# Patient Record
Sex: Male | Born: 1985 | Race: Black or African American | Hispanic: No | Marital: Single | Smoking: Current every day smoker
Health system: Southern US, Community
[De-identification: ages and names within clinical notes are randomized; demographics above are authoritative.]

---
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
## 2004-06-13 ENCOUNTER — Emergency Department (HOSPITAL_COMMUNITY): Admission: EM | Admit: 2004-06-13 | Discharge: 2004-06-13 | Payer: Self-pay | Admitting: Emergency Medicine

## 2007-01-29 ENCOUNTER — Emergency Department (HOSPITAL_COMMUNITY): Admission: EM | Admit: 2007-01-29 | Discharge: 2007-01-29 | Payer: Self-pay | Admitting: Emergency Medicine

## 2010-07-22 ENCOUNTER — Inpatient Hospital Stay (INDEPENDENT_AMBULATORY_CARE_PROVIDER_SITE_OTHER)
Admission: RE | Admit: 2010-07-22 | Discharge: 2010-07-22 | Disposition: A | Discharge status: Home / Self Care | Payer: Self-pay | Source: Ambulatory Visit | Attending: Family Medicine | Admitting: Family Medicine

## 2010-07-22 DIAGNOSIS — M79609 Pain in unspecified limb: Secondary | ICD-10-CM

## 2010-07-22 DIAGNOSIS — S139XXA Sprain of joints and ligaments of unspecified parts of neck, initial encounter: Secondary | ICD-10-CM

## 2010-08-19 ENCOUNTER — Emergency Department (HOSPITAL_COMMUNITY)
Admission: EM | Admit: 2010-08-19 | Discharge: 2010-08-19 | Disposition: A | Discharge status: Home / Self Care | Payer: Self-pay | Attending: Emergency Medicine | Admitting: Emergency Medicine

## 2010-08-19 DIAGNOSIS — W540XXA Bitten by dog, initial encounter: Secondary | ICD-10-CM | POA: Insufficient documentation

## 2010-08-19 DIAGNOSIS — S199XXA Unspecified injury of neck, initial encounter: Secondary | ICD-10-CM | POA: Insufficient documentation

## 2010-08-19 DIAGNOSIS — Z203 Contact with and (suspected) exposure to rabies: Secondary | ICD-10-CM | POA: Insufficient documentation

## 2010-08-19 DIAGNOSIS — S0993XA Unspecified injury of face, initial encounter: Secondary | ICD-10-CM | POA: Insufficient documentation

## 2010-08-19 DIAGNOSIS — Z23 Encounter for immunization: Secondary | ICD-10-CM | POA: Insufficient documentation

## 2010-08-22 ENCOUNTER — Inpatient Hospital Stay (INDEPENDENT_AMBULATORY_CARE_PROVIDER_SITE_OTHER)
Admission: RE | Admit: 2010-08-22 | Discharge: 2010-08-22 | Disposition: A | Discharge status: Home / Self Care | Payer: Self-pay | Source: Ambulatory Visit | Attending: Family Medicine | Admitting: Family Medicine

## 2010-08-22 DIAGNOSIS — Z23 Encounter for immunization: Secondary | ICD-10-CM

## 2010-08-26 ENCOUNTER — Inpatient Hospital Stay (INDEPENDENT_AMBULATORY_CARE_PROVIDER_SITE_OTHER)
Admission: RE | Admit: 2010-08-26 | Discharge: 2010-08-26 | Disposition: A | Discharge status: Home / Self Care | Payer: Self-pay | Source: Ambulatory Visit | Attending: Family Medicine | Admitting: Family Medicine

## 2010-08-26 DIAGNOSIS — Z23 Encounter for immunization: Secondary | ICD-10-CM

## 2010-09-02 ENCOUNTER — Inpatient Hospital Stay (INDEPENDENT_AMBULATORY_CARE_PROVIDER_SITE_OTHER)
Admission: RE | Admit: 2010-09-02 | Discharge: 2010-09-02 | Disposition: A | Discharge status: Home / Self Care | Payer: Self-pay | Source: Ambulatory Visit | Attending: Family Medicine | Admitting: Family Medicine

## 2010-09-02 DIAGNOSIS — Z23 Encounter for immunization: Secondary | ICD-10-CM

## 2011-07-11 ENCOUNTER — Emergency Department (HOSPITAL_COMMUNITY)
Admission: EM | Admit: 2011-07-11 | Discharge: 2011-07-11 | Disposition: A | Discharge status: Home / Self Care | Payer: Self-pay | Attending: Emergency Medicine | Admitting: Emergency Medicine

## 2011-07-11 ENCOUNTER — Encounter (HOSPITAL_COMMUNITY): Payer: Self-pay | Admitting: *Deleted

## 2011-07-11 DIAGNOSIS — K029 Dental caries, unspecified: Secondary | ICD-10-CM | POA: Insufficient documentation

## 2011-07-11 DIAGNOSIS — F17200 Nicotine dependence, unspecified, uncomplicated: Secondary | ICD-10-CM | POA: Insufficient documentation

## 2011-07-11 DIAGNOSIS — K0889 Other specified disorders of teeth and supporting structures: Secondary | ICD-10-CM

## 2011-07-11 MED ORDER — HYDROCODONE-ACETAMINOPHEN 5-325 MG PO TABS: ORAL_TABLET | ORAL | Status: AC

## 2011-07-11 MED ORDER — PENICILLIN V POTASSIUM 500 MG PO TABS: 500.0000 mg | ORAL_TABLET | Freq: Three times a day (TID) | ORAL | Status: AC

## 2011-07-11 NOTE — ED Provider Notes (Signed)
History   This chart was scribed for Gavin Pound. Nerida Boivin, MD scribed by Magnus Sinning. The patient was seen in room TR06C/TR06C seen at 11:44   CSN: [id number]  Arrival date & time 07/11/11  1117   First MD Initiated Contact with Patient 07/11/11 1137      Chief Complaint  Patient presents with  . Dental Pain    (Consider location/radiation/quality/duration/timing/severity/associated sxs/prior treatment) The history is provided by the patient.   Gary Hale is a 26 y.o. male who presents to the Emergency Department complaining of constant moderate bilateral lower dental pain with associated swelling ( more prominent on the right) , onset three days. Patient states he has hx of dental problems and that he has had root canal several years ago. States that he had a fever on the first day, but explains that has resolve. He has tried ibuprofen with mild relief and states at night it feels like something is draining into throat. Unable to see a dentist currently due to lack of insurance.    History reviewed. No pertinent past medical history.  History reviewed. No pertinent past surgical history.  History reviewed. No pertinent family history.  History  Substance Use Topics  . Smoking status: Current Everyday Smoker    Types: Cigarettes  . Smokeless tobacco: Not on file  . Alcohol Use: Yes     occ    Review of Systems  Constitutional: Negative for fever and chills.  HENT: Positive for dental problem. Negative for ear pain, sore throat and trouble swallowing.   Respiratory: Negative for shortness of breath.     Allergies  Review of patient's allergies indicates no known allergies.  Home Medications   Current Outpatient Rx  Name Route Sig Dispense Refill  . BENZOCAINE 10 % MT GEL Mouth/Throat Use as directed 1 application in the mouth or throat as needed. For pain    . IBUPROFEN 200 MG PO TABS Oral Take 400 mg by mouth every 4 (four) hours as needed. For pain      BP 126/76   Pulse 81  Temp 98 F (36.7 C) (Oral)  Resp 18  SpO2 97%  Physical Exam  Nursing note and vitals reviewed. Constitutional: He is oriented to person, place, and time. He appears well-developed and well-nourished. No distress.  HENT:  Head: Normocephalic and atraumatic.  Mouth/Throat: Uvula is midline, oropharynx is clear and moist and mucous membranes are normal.       Right lower side: Missing incisior. 1st and 2nd molars are decayed. And 3rd molar is impacted. 2nd molar most tender. Left lower side:2nd molar is missing and 3rd molar is impacted.No erythema, fluctuance or abscess. No trismus   Eyes: EOM are normal.  Neck: Neck supple. No tracheal deviation present.       No discrete lymphadenopathy.   Cardiovascular: Normal rate.   Pulmonary/Chest: Effort normal. No respiratory distress.       Lungs clear. No stridor.  Musculoskeletal: Normal range of motion.  Lymphadenopathy:    He has no cervical adenopathy.  Neurological: He is alert and oriented to person, place, and time.  Skin: Skin is warm and dry.  Psychiatric: He has a normal mood and affect. His behavior is normal.    ED Course  Procedures (including critical care time) DIAGNOSTIC STUDIES: Oxygen Saturation is 97% on room air, normal by my interpretation.    COORDINATION OF CARE:  Labs Reviewed - No data to display No results found.   1. Pain, dental  MDM  I personally performed the services described in this documentation, which was scribed in my presence. The recorded information has been reviewed and considered.  Pt is in no distress.  Tender along molar teeth, no visible abscess.   Needs acute and ongoing dental care.  No obv lesion or sig tenderness on left lower jaw.  Referred to Dr. Lucky Cowboy to see in 1-2 days. Pt told he needs to inform them of referral and that he nees to keep his paperwork.        Gavin Pound. Lucine Bilski, MD 07/11/11 1229

## 2011-07-11 NOTE — Discharge Instructions (Signed)
Dental Pain A tooth ache may be caused by cavities (tooth decay). Cavities expose the nerve of the tooth to air and hot or cold temperatures. It may come from an infection or abscess (also called a boil or furuncle) around your tooth. It is also often caused by dental caries (tooth decay). This causes the pain you are having. DIAGNOSIS  Your caregiver can diagnose this problem by exam. TREATMENT   If caused by an infection, it may be treated with medications which kill germs (antibiotics) and pain medications as prescribed by your caregiver. Take medications as directed.   Only take over-the-counter or prescription medicines for pain, discomfort, or fever as directed by your caregiver.   Whether the tooth ache today is caused by infection or dental disease, you should see your dentist as soon as possible for further care.  SEEK MEDICAL CARE IF: The exam and treatment you received today has been provided on an emergency basis only. This is not a substitute for complete medical or dental care. If your problem worsens or new problems (symptoms) appear, and you are unable to meet with your dentist, call or return to this location. SEEK IMMEDIATE MEDICAL CARE IF:   You have a fever.   You develop redness and swelling of your face, jaw, or neck.   You are unable to open your mouth.   You have severe pain uncontrolled by pain medicine.  MAKE SURE YOU:   Understand these instructions.   Will watch your condition.   Will get help right away if you are not doing well or get worse.  Document Released: 12/28/2004 Document Revised: 12/17/2010 Document Reviewed: 08/16/2007 [employer] Patient Information 2012 [employer].    Narcotic and benzodiazepine use may cause drowsiness, slowed breathing or dependence.  Please use with caution and do not drive, operate machinery or watch young children alone while taking them.  Taking combinations of these medications or drinking alcohol will potentiate  these effects.    

## 2011-07-11 NOTE — ED Notes (Signed)
Reports bilateral lower toothache for two days. Airway intact.

## 2011-08-04 ENCOUNTER — Encounter (HOSPITAL_COMMUNITY): Payer: Self-pay | Admitting: *Deleted

## 2011-08-04 ENCOUNTER — Emergency Department (HOSPITAL_COMMUNITY): Payer: No Typology Code available for payment source

## 2011-08-04 ENCOUNTER — Emergency Department (HOSPITAL_COMMUNITY)
Admission: EM | Admit: 2011-08-04 | Discharge: 2011-08-04 | Disposition: A | Discharge status: Home / Self Care | Payer: No Typology Code available for payment source | Attending: Emergency Medicine | Admitting: Emergency Medicine

## 2011-08-04 DIAGNOSIS — S40019A Contusion of unspecified shoulder, initial encounter: Secondary | ICD-10-CM | POA: Insufficient documentation

## 2011-08-04 DIAGNOSIS — S40011A Contusion of right shoulder, initial encounter: Secondary | ICD-10-CM

## 2011-08-04 DIAGNOSIS — M546 Pain in thoracic spine: Secondary | ICD-10-CM

## 2011-08-04 MED ORDER — IBUPROFEN 800 MG PO TABS
800.0000 mg | ORAL_TABLET | Freq: Once | ORAL | Status: AC
  Administered 2011-08-04: 800 mg via ORAL
  Filled 2011-08-04: qty 1

## 2011-08-04 MED ORDER — NAPROXEN 375 MG PO TABS: 375.0000 mg | ORAL_TABLET | Freq: Two times a day (BID) | ORAL | Status: AC

## 2011-08-04 MED ORDER — CYCLOBENZAPRINE HCL 10 MG PO TABS: 10.0000 mg | ORAL_TABLET | Freq: Two times a day (BID) | ORAL | Status: AC | PRN

## 2011-08-04 NOTE — ED Provider Notes (Signed)
Medical screening examination/treatment/procedure(s) were performed by non-physician practitioner and as supervising physician I was immediately available for consultation/collaboration.  Derwood Kaplan, MD 08/04/11 1843
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
## 2011-08-04 NOTE — ED Notes (Signed)
ZOX:[id number]<AV> Expected date:08/04/11<BR> Expected time:11:15 AM<BR> Means of arrival:Ambulance<BR> Comments:<BR> ems

## 2011-08-04 NOTE — ED Notes (Signed)
Patient transported to X-ray 

## 2011-08-07 NOTE — ED Provider Notes (Signed)
Medical screening examination/treatment/procedure(s) were performed by non-physician practitioner and as supervising physician I was immediately available for consultation/collaboration.  Derwood Kaplan, MD 08/07/11 1445

## 2012-11-16 ENCOUNTER — Emergency Department (INDEPENDENT_AMBULATORY_CARE_PROVIDER_SITE_OTHER): Payer: Self-pay

## 2012-11-16 ENCOUNTER — Emergency Department (INDEPENDENT_AMBULATORY_CARE_PROVIDER_SITE_OTHER)
Admission: EM | Admit: 2012-11-16 | Discharge: 2012-11-16 | Disposition: A | Discharge status: Home / Self Care | Payer: Self-pay | Source: Home / Self Care | Attending: Emergency Medicine | Admitting: Emergency Medicine

## 2012-11-16 ENCOUNTER — Encounter (HOSPITAL_COMMUNITY): Payer: Self-pay | Admitting: Emergency Medicine

## 2012-11-16 DIAGNOSIS — S20219A Contusion of unspecified front wall of thorax, initial encounter: Secondary | ICD-10-CM

## 2012-11-16 MED ORDER — MELOXICAM 15 MG PO TABS: 15.0000 mg | ORAL_TABLET | Freq: Every day | ORAL | Status: DC

## 2012-11-16 MED ORDER — TRAMADOL HCL 50 MG PO TABS: 100.0000 mg | ORAL_TABLET | Freq: Three times a day (TID) | ORAL | Status: DC | PRN
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
## 2014-02-19 ENCOUNTER — Encounter (HOSPITAL_COMMUNITY): Payer: Self-pay | Admitting: Emergency Medicine

## 2014-02-19 ENCOUNTER — Emergency Department (INDEPENDENT_AMBULATORY_CARE_PROVIDER_SITE_OTHER)
Admission: EM | Admit: 2014-02-19 | Discharge: 2014-02-19 | Disposition: A | Discharge status: Home / Self Care | Payer: 59 | Source: Home / Self Care | Attending: Family Medicine | Admitting: Family Medicine

## 2014-02-19 DIAGNOSIS — J069 Acute upper respiratory infection, unspecified: Secondary | ICD-10-CM

## 2014-02-19 NOTE — ED Notes (Signed)
C/o  Productive cough.  Runny nose.  1 vomiting episode.  Denies fever, sob, n/v.  No otc treatments tried.   Symptoms present since Sunday.

## 2014-02-19 NOTE — ED Provider Notes (Signed)
CSN: [id number]     Arrival date & time 02/19/14  1153 History   First MD Initiated Contact with Patient 02/19/14 1301     No chief complaint on file.  (Consider location/radiation/quality/duration/timing/severity/associated sxs/prior Treatment) HPI Comments: 29 year old male complaining of cold symptoms for approximately 2 days. He is not taking any medications for symptoms.   No past medical history on file. No past surgical history on file. No family history on file. History  Substance Use Topics  . Smoking status: Current Every Day Smoker -- 1.00 packs/day    Types: Cigarettes  . Smokeless tobacco: Not on file  . Alcohol Use: No     Comment: occ    Review of Systems  Constitutional: Negative for fever, diaphoresis, activity change and fatigue.  HENT: Positive for congestion, postnasal drip and rhinorrhea. Negative for ear pain, facial swelling, hearing loss, sore throat and trouble swallowing.   Eyes: Negative for pain, discharge and redness.  Respiratory: Positive for cough. Negative for chest tightness and shortness of breath.   Cardiovascular: Negative.   Gastrointestinal: Negative.   Musculoskeletal: Negative.  Negative for neck pain and neck stiffness.  Neurological: Negative.     Allergies  Review of patient's allergies indicates no known allergies.  Home Medications   Prior to Admission medications   Medication Sig Start Date End Date Taking? Authorizing Provider  ibuprofen (ADVIL,MOTRIN) 200 MG tablet Take 200 mg by mouth every 6 (six) hours as needed.    Historical Provider, MD  meloxicam (MOBIC) 15 MG tablet Take 1 tablet (15 mg total) by mouth daily. 11/16/12   Reuben Likesavid C Keller, MD  Naproxen Sodium (ALEVE PO) Take by mouth.    Historical Provider, MD  traMADol (ULTRAM) 50 MG tablet Take 2 tablets (100 mg total) by mouth every 8 (eight) hours as needed. 11/16/12   Reuben Likesavid C Keller, MD   BP 121/75 mmHg  Pulse 70  Temp(Src) 98.4 F (36.9 C) (Oral)  Resp 12  SpO2  97% Physical Exam  Constitutional: He is oriented to person, place, and time. He appears well-developed and well-nourished. No distress.  HENT:  Mouth/Throat: No oropharyngeal exudate.  Bilateral TMs are normal Oropharynx with minor erythema and copious amount of clear PND.  Eyes: Conjunctivae are normal.  Neck: Normal range of motion. Neck supple.  Cardiovascular: Normal rate and regular rhythm.   Pulmonary/Chest: Effort normal and breath sounds normal. No respiratory distress. He has no wheezes. He has no rales.  Musculoskeletal: Normal range of motion. He exhibits no edema.  Lymphadenopathy:    He has no cervical adenopathy.  Neurological: He is alert and oriented to person, place, and time.  Skin: Skin is warm and dry. No rash noted.  Psychiatric: He has a normal mood and affect.  Nursing note and vitals reviewed.   ED Course  Procedures (including critical care time) Labs Review Labs Reviewed - No data to display  Imaging Review No results found.   MDM   1. URI (upper respiratory infection)    Recommend using DayQuil plus Allegra or Zyrtec for drainage. Lots of saline nasal spray Drink lots of fluids.     Hayden Rasmussenavid Diva Lemberger, NP 02/19/14 1310

## 2014-02-19 NOTE — Discharge Instructions (Signed)
Upper Respiratory Infection, Adult Recommend using DayQuil plus Allegra or Zyrtec for drainage. Lots of saline nasal spray Drink lots of fluids. An upper respiratory infection (URI) is also sometimes known as the common cold. The upper respiratory tract includes the nose, sinuses, throat, trachea, and bronchi. Bronchi are the airways leading to the lungs. Most people improve within 1 week, but symptoms can last up to 2 weeks. A residual cough may last even longer.  CAUSES Many different viruses can infect the tissues lining the upper respiratory tract. The tissues become irritated and inflamed and often become very moist. Mucus production is also common. A cold is contagious. You can easily spread the virus to others by oral contact. This includes kissing, sharing a glass, coughing, or sneezing. Touching your mouth or nose and then touching a surface, which is then touched by another person, can also spread the virus. SYMPTOMS  Symptoms typically develop 1 to 3 days after you come in contact with a cold virus. Symptoms vary from person to person. They may include:  Runny nose.  Sneezing.  Nasal congestion.  Sinus irritation.  Sore throat.  Loss of voice (laryngitis).  Cough.  Fatigue.  Muscle aches.  Loss of appetite.  Headache.  Low-grade fever. DIAGNOSIS  You might diagnose your own cold based on familiar symptoms, since most people get a cold 2 to 3 times a year. Your caregiver can confirm this based on your exam. Most importantly, your caregiver can check that your symptoms are not due to another disease such as strep throat, sinusitis, pneumonia, asthma, or epiglottitis. Blood tests, throat tests, and X-rays are not necessary to diagnose a common cold, but they may sometimes be helpful in excluding other more serious diseases. Your caregiver will decide if any further tests are required. RISKS AND COMPLICATIONS  You may be at risk for a more severe case of the common cold if  you smoke cigarettes, have chronic heart disease (such as heart failure) or lung disease (such as asthma), or if you have a weakened immune system. The very young and very old are also at risk for more serious infections. Bacterial sinusitis, middle ear infections, and bacterial pneumonia can complicate the common cold. The common cold can worsen asthma and chronic obstructive pulmonary disease (COPD). Sometimes, these complications can require emergency medical care and may be life-threatening. PREVENTION  The best way to protect against getting a cold is to practice good hygiene. Avoid oral or hand contact with people with cold symptoms. Wash your hands often if contact occurs. There is no clear evidence that vitamin C, vitamin E, echinacea, or exercise reduces the chance of developing a cold. However, it is always recommended to get plenty of rest and practice good nutrition. TREATMENT  Treatment is directed at relieving symptoms. There is no cure. Antibiotics are not effective, because the infection is caused by a virus, not by bacteria. Treatment may include:  Increased fluid intake. Sports drinks offer valuable electrolytes, sugars, and fluids.  Breathing heated mist or steam (vaporizer or shower).  Eating chicken soup or other clear broths, and maintaining good nutrition.  Getting plenty of rest.  Using gargles or lozenges for comfort.  Controlling fevers with ibuprofen or acetaminophen as directed by your caregiver.  Increasing usage of your inhaler if you have asthma. Zinc gel and zinc lozenges, taken in the first 24 hours of the common cold, can shorten the duration and lessen the severity of symptoms. Pain medicines may help with fever, muscle aches,  and throat pain. A variety of non-prescription medicines are available to treat congestion and runny nose. Your caregiver can make recommendations and may suggest nasal or lung inhalers for other symptoms.  HOME CARE INSTRUCTIONS   Only  take over-the-counter or prescription medicines for pain, discomfort, or fever as directed by your caregiver.  Use a warm mist humidifier or inhale steam from a shower to increase air moisture. This may keep secretions moist and make it easier to breathe.  Drink enough water and fluids to keep your urine clear or pale yellow.  Rest as needed.  Return to work when your temperature has returned to normal or as your caregiver advises. You may need to stay home longer to avoid infecting others. You can also use a face mask and careful hand washing to prevent spread of the virus. SEEK MEDICAL CARE IF:   After the first few days, you feel you are getting worse rather than better.  You need your caregiver's advice about medicines to control symptoms.  You develop chills, worsening shortness of breath, or brown or red sputum. These may be signs of pneumonia.  You develop yellow or brown nasal discharge or pain in the face, especially when you bend forward. These may be signs of sinusitis.  You develop a fever, swollen neck glands, pain with swallowing, or white areas in the back of your throat. These may be signs of strep throat. SEEK IMMEDIATE MEDICAL CARE IF:   You have a fever.  You develop severe or persistent headache, ear pain, sinus pain, or chest pain.  You develop wheezing, a prolonged cough, cough up blood, or have a change in your usual mucus (if you have chronic lung disease).  You develop sore muscles or a stiff neck. Document Released: 06/23/2000 Document Revised: 03/22/2011 Document Reviewed: 04/04/2013 [facility] Patient Information 2015 [place] [place]. This information is not intended to replace advice given to you by your health care provider. Make sure you discuss any questions you have with your health care provider.

## 2017-09-11 ENCOUNTER — Emergency Department (HOSPITAL_COMMUNITY)
Admission: EM | Admit: 2017-09-11 | Discharge: 2017-09-11 | Disposition: A | Discharge status: Home / Self Care | Payer: 59 | Attending: Emergency Medicine | Admitting: Emergency Medicine

## 2017-09-11 ENCOUNTER — Other Ambulatory Visit: Payer: Self-pay

## 2017-09-11 DIAGNOSIS — K047 Periapical abscess without sinus: Secondary | ICD-10-CM | POA: Insufficient documentation

## 2017-09-11 DIAGNOSIS — F17210 Nicotine dependence, cigarettes, uncomplicated: Secondary | ICD-10-CM | POA: Insufficient documentation

## 2017-09-11 DIAGNOSIS — Z79899 Other long term (current) drug therapy: Secondary | ICD-10-CM | POA: Insufficient documentation

## 2017-09-11 MED ORDER — CHLORHEXIDINE GLUCONATE 0.12 % MT SOLN: 15.0000 mL | Freq: Two times a day (BID) | OROMUCOSAL | 0 refills | Status: DC

## 2017-09-11 MED ORDER — PENICILLIN V POTASSIUM 500 MG PO TABS: 500.0000 mg | ORAL_TABLET | Freq: Four times a day (QID) | ORAL | 0 refills | Status: AC

## 2017-09-11 NOTE — ED Provider Notes (Signed)
[facility] EMERGENCY DEPARTMENT Provider Note   CSN: [id number] Arrival date & time: 09/11/17  1025     History   Chief Complaint Chief Complaint  Patient presents with  . Dental Pain  . Facial Pain    HPI Gary Hale is a 32 y.o. male presenting with left upper dental pain for the past 3 days.  Patient states that he has been having dental problems for 5 years, states that he has had multiple teeth fall out in the past and have caused pain all throughout his mouth however the pain that he is presenting with today only began 3 days ago.  Patient describes his pain as a sharp 6/10 in severity pain to the left upper side of his mouth that is worse with chewing on that side.  Patient states that he has noticed some drainage from that area.  Patient has been treating his pain with ibuprofen with complete relief of his pain.  Additionally patient states that his left cheek is tender to palpation when he presses on the area.  Patient denies vision changes, fever, headache, neck pain, trouble swallowing.  HPI  No past medical history on file.  There are no active problems to display for this patient.   No past surgical history on file.      Home Medications    Prior to Admission medications   Medication Sig Start Date End Date Taking? Authorizing Provider  chlorhexidine (PERIDEX) 0.12 % solution Use as directed 15 mLs in the mouth or throat 2 (two) times daily. 09/11/17   Harlene Salts A, PA-C  ibuprofen (ADVIL,MOTRIN) 200 MG tablet Take 200 mg by mouth every 6 (six) hours as needed.    [provider]  meloxicam (MOBIC) 15 MG tablet Take 1 tablet (15 mg total) by mouth daily. 11/16/12   Reuben Likes, MD  Naproxen Sodium (ALEVE PO) Take by mouth.    [provider]  penicillin v potassium (VEETID) 500 MG tablet Take 1 tablet (500 mg total) by mouth 4 (four) times daily for 7 days. 09/11/17 09/18/17  Harlene Salts A, PA-C  traMADol (ULTRAM)  50 MG tablet Take 2 tablets (100 mg total) by mouth every 8 (eight) hours as needed. 11/16/12   Reuben Likes, MD    Family History No family history on file.  Social History Social History   Tobacco Use  . Smoking status: Current Every Day Smoker    Packs/day: 1.00    Types: Cigarettes  Substance Use Topics  . Alcohol use: No    Comment: occ  . Drug use: No     Allergies   Patient has no known allergies.   Review of Systems Review of Systems  Constitutional: Negative.  Negative for chills, fatigue and fever.  HENT: Positive for dental problem. Negative for drooling, sore throat, trouble swallowing and voice change.   Eyes: Negative.  Negative for pain and visual disturbance.  Musculoskeletal: Negative.  Negative for neck pain and neck stiffness.  Skin: Negative.  Negative for color change and rash.  Neurological: Negative.  Negative for headaches.     Physical Exam Updated Vital Signs BP 126/85   Pulse 75   Temp 98.9 F (37.2 C)   Resp 18   SpO2 99%   Physical Exam  Constitutional: He is oriented to person, place, and time. He appears well-developed and well-nourished. No distress.  HENT:  Head: Normocephalic and atraumatic.  Right Ear: External ear normal.  Left  Ear: External ear normal.  Nose: Nose normal. Right sinus exhibits no maxillary sinus tenderness. Left sinus exhibits no maxillary sinus tenderness.  Mouth/Throat: Uvula is midline, oropharynx is clear and moist and mucous membranes are normal. No trismus in the jaw. Dental abscesses present. No uvula swelling. No oropharyngeal exudate, posterior oropharyngeal edema, posterior oropharyngeal erythema or tonsillar abscesses.    Patient with overall poor dentition.  Multiple teeth missing.  Large dental carry to left upper indicated on imaging, there is scant purulent drainage noted.  Minimal gingival erythema and swelling.  No signs of peritonsillar abscess, Ludwig's angina or retropharyngeal  abscess.  There is no swelling to the patient's left cheek. On palpation area is tender across where the patient's dental abscess is however the skin is not fluctuant, no induration no crepitus. No tenderness to the zygomatic bone or maxillary sinus area. Patient with full range of motion of jaw.  Eyes: Pupils are equal, round, and reactive to light. Conjunctivae and EOM are normal.  Full extraocular movement intact, no entrapment.  No pain with extraocular movements.  No swelling around the eye. No signs of orbital or preseptal cellulitis.  Neck: Trachea normal, normal range of motion, full passive range of motion without pain and phonation normal. Neck supple. No tracheal tenderness present. No neck rigidity. No tracheal deviation, no edema and no erythema present.  Pulmonary/Chest: Effort normal. No respiratory distress.  Abdominal: Soft. There is no tenderness. There is no rebound and no guarding.  Musculoskeletal: Normal range of motion.  Neurological: He is alert and oriented to person, place, and time.  Skin: Skin is warm and dry.  Psychiatric: He has a normal mood and affect. His behavior is normal.     ED Treatments / Results  Labs (all labs ordered are listed, but only abnormal results are displayed) Labs Reviewed - No data to display  EKG None  Radiology No results found.  Procedures Procedures (including critical care time)  Medications Ordered in ED Medications - No data to display   Initial Impression / Assessment and Plan / ED Course  I have reviewed the triage vital signs and the nursing notes.  Pertinent labs & imaging results that were available during my care of the patient were reviewed by me and considered in my medical decision making (see chart for details).    Patient with dental pain. Multiple infected dental surface cavity noted with signs of dental abscess to left upper. Patient is well-appearing, afebrile, nontoxic, speaking well, not  tachycardic. Patient able to swallow without pain.  No signs of swelling or concern for Ludwig's angina/Peritonsilar abscess/Retropharyngeal abscess or other deep tissue infections.  No sign of swelling of the neck, patient has good range of motion of the neck, no trismus.  Triage note mentions left cheek feeling hard to touch. On my examination there are no signs of cellulitis, preseptal cellulitis or orbital cellulitis. Area is not swollen, erythematous, indurated or warm to touch. Area does not feel tense/hard to touch. Patient with full EOMI without pain. No facial or orbital swelling. I suspect that the patients tenderness to palpation of the left cheek is due to his dental pain/abscess in that area.  Will treat with Pen VK for his dental abscess and encourage dental follow-up as soon as possible. Peridex mouth rinse also prescribed.  At this time there does not appear to be any evidence of an acute emergency medical condition and the patient appears stable for discharge with appropriate outpatient follow up. Diagnosis was  discussed with patient who verbalizes understanding of care plan and is agreeable to discharge. I have discussed return precautions with patient who verbalizes understanding of return precautions. Patient strongly encouraged to follow-up with their PCP. All questions answered.    Note: Portions of this report may have been transcribed using voice recognition software. Every effort was made to ensure accuracy; however, inadvertent computerized transcription errors may still be present.  Final Clinical Impressions(s) / ED Diagnoses   Final diagnoses:  Dental abscess    ED Discharge Orders         Ordered    penicillin v potassium (VEETID) 500 MG tablet  4 times daily     09/11/17 1115    chlorhexidine (PERIDEX) 0.12 % solution  2 times daily,   Status:  Discontinued     09/11/17 1115    chlorhexidine (PERIDEX) 0.12 % solution  2 times daily     09/11/17 1116             Bill Salinas, PA-C 09/11/17 1746    Melene Plan, DO 09/12/17 0719

## 2017-09-11 NOTE — Discharge Instructions (Signed)
Please return to the Emergency Department for any new or worsening symptoms or if your symptoms do not improve. Please be sure to follow up with your Primary Care Physician as soon as possible regarding your visit today. If you do not have a Primary Doctor please use the resources below to establish one. Please follow-up with a dentist as soon as possible. You may use the information below to try to establish a dentist. Please use all of your antibiotic Penicillin as prescribed. You may use the mouth rinse Peridex as prescribed.  Contact a health care provider if: Your pain is worse and is not helped by medicine. Get help right away if: You have a fever or chills. Your symptoms suddenly get worse. You have a very bad headache. You have problems breathing or swallowing. You have trouble opening your mouth. You have swelling in your neck or around your eye.  RESOURCE GUIDE  Chronic Pain Problems: Contact [facility]  [phone] Patients need to be referred by their primary care doctor.  Insufficient Money for Medicine: Contact United Way:  call "211" or [facility] [phone] 661-9303.  No Primary Care Doctor: Call [facility]  [phone] - can help you locate a primary care doctor that  accepts your insurance, provides certain services, etc. Physician Referral Service- [phone]  Agencies that provide inexpensive medical care: [facility]  [phone] [facility]  [phone] [facility]  [phone] [facility]  [phone] [facility]  [phone] [facility]  [phone]  Medicaid-accepting [facility] Providers: [facility]- [address]  [phone], Mon-Fri 9am-7pm, Sat 9am-1pm [facility]- [address] [place], Suite [place]  [phone] [facility]- [address] [place]  [phone] [facility]-  [address]  [phone] Renaye Rakers- [address] [place], [address], [phone]  Only accepts [place] patients after they have their name  applied to their card  Self Pay (no insurance) in [facility]: Sickle Cell Patients: Dr Willey Blade, [facility]  [address] [place], [phone] [facility]- [address] [place]  [phone]       [facility] [place]- [address]       -     [facility]- see information above (Speak to [employer] if you do not have insurance)       -  [facility]- [address] [place], [phone]       -  [facility]- [address],  [phone]       -  [facility]- [address] [phone]       -  Dr Julio Sicks-  [address], [place], [phone]       -  [facility]- [address], [phone]       -  [facility]- [address] [phone], also [address] [phone]       -    [facility]- [address] [place], [phone], 1st & 3rd Saturday   every month, 10am-1pm  1) Find a Doctor and Pay Out of Pocket Although you won't have to find out who is covered by your insurance plan, it is a good idea to ask around and get recommendations.  You will then need to call the office and see if the doctor you have chosen will accept you as a new patient and what types of options they offer for patients who are self-pay. Some doctors offer discounts or will set up payment plans for their patients who do not have insurance, but you will need to ask so you aren't surprised when you get to your appointment.  2) Contact Your Local Health Department Not all health departments have doctors that can see patients for sick visits, but many do, so it is worth a call to see if yours does. If you don't know where your local health department is, you can check in your phone book. The CDC also has a tool to help you locate  your state's health department, and many state websites also have listings of all of their local health departments.  3) Find a Walk-in Clinic If your illness is not likely to be very severe or complicated, you may want to try a walk in clinic. These are popping up all over the country in pharmacies, drugstores, and shopping centers. They're usually staffed by nurse practitioners or physician assistants that have been trained to treat common illnesses and complaints. They're usually fairly quick and inexpensive. However, if you have serious medical issues or chronic medical problems, these are probably not your best option  STD Testing [facility] Department of [facility], STD Clinic, [address] [place], phone [phone] or [phone].  Monday - Friday, call for an appointment. [facility] Department of [employer], STD Clinic, [place] [address], [place], phone [phone] or [phone].  Monday - Friday, call for an appointment.  Abuse/Neglect: [facility] Child Abuse Hotline 517-359-7349 [facility] Child Abuse Hotline (805)349-3225 (After Hours)  Emergency Shelter:  [facility] 9317565089  Maternity Homes: Room at the [place] [facility] [phone] [facility] [phone]  MRSA Hotline #:   901-568-4452  [facility] Resources  [facility] [place]  United Way [facility] Dept. [address]                 [address]         [address]  Blondell Reveal Phone:  [phone]                                  Phone:  [phone]                   Phone:  [phone]  [facility], [phone] [phone]       -     [facility] in [place], [address],  [phone], [place] [phone] or [phone] (After Hours)   [place]  Substance Abuse Resources: Alcohol and Drug Services  [place] 205-104-6995 [facility] Chinita Pester 475-752-8524 Residential & Outpatient Substance Abuse Program  [phone]  Psychological Services: [place]  [phone] [place]  [place], [place] [address] [place], [place]: [phone] or [phone], [employer]  Dental Assistance  If unable to pay or uninsured, contact:  [facility] or [facility]. to become qualified for the adult dental clinic.  Patients with Medicaid: [facility] [phone] [address], [place] [address], [phone]  If unable to pay, or uninsured, contact [facility] 807-228-1458) or [employer] [phone] in [place], [place] in [facility]) to become qualified for the adult dental clinic   Other [place]- [place], [place], [place], [address], [place], [place], 2nd and 4th Thursday of the month at 6:30am.  10 clients each day by appointment, can sometimes see walk-in patients if someone does not show for an appointment. [facility]- [address] [place]ard Danker [place], [place], [address], [place], [place], [place], [address], [place] Department- [phone] [facility] [phone]

## 2017-09-11 NOTE — ED Triage Notes (Signed)
Pt states for 5 years he has had dental pain to the left upper teeth. States for several days he has had swelling to the left upper gums, area to left cheek feels hard to touch with minimal swelling noted.

## 2017-11-28 ENCOUNTER — Ambulatory Visit (HOSPITAL_COMMUNITY)
Admission: EM | Admit: 2017-11-28 | Discharge: 2017-11-28 | Disposition: A | Discharge status: Home / Self Care | Payer: PRIVATE HEALTH INSURANCE | Attending: Family Medicine | Admitting: Family Medicine

## 2017-11-28 ENCOUNTER — Encounter (HOSPITAL_COMMUNITY): Payer: Self-pay | Admitting: *Deleted

## 2017-11-28 DIAGNOSIS — J111 Influenza due to unidentified influenza virus with other respiratory manifestations: Secondary | ICD-10-CM

## 2017-11-28 DIAGNOSIS — R69 Illness, unspecified: Secondary | ICD-10-CM | POA: Insufficient documentation

## 2017-11-28 MED ORDER — ACETAMINOPHEN 325 MG PO TABS
ORAL_TABLET | ORAL | Status: AC
  Filled 2017-11-28: qty 2

## 2017-11-28 MED ORDER — HYDROCODONE-HOMATROPINE 5-1.5 MG/5ML PO SYRP: 5.0000 mL | ORAL_SOLUTION | Freq: Four times a day (QID) | ORAL | 0 refills | Status: DC | PRN

## 2017-11-28 MED ORDER — OSELTAMIVIR PHOSPHATE 75 MG PO CAPS: 75.0000 mg | ORAL_CAPSULE | Freq: Two times a day (BID) | ORAL | 0 refills | Status: DC

## 2017-11-28 MED ORDER — ACETAMINOPHEN 325 MG PO TABS
650.0000 mg | ORAL_TABLET | Freq: Once | ORAL | Status: AC
  Administered 2017-11-28: 650 mg via ORAL

## 2017-11-28 NOTE — ED Provider Notes (Signed)
[facility]    CSN: [id number] Arrival date & time: 11/28/17  1737     History   Chief Complaint Chief Complaint  Patient presents with  . Headache  . Emesis  . Cough  . Nasal Congestion  . Generalized Body Aches    HPI Gary Hale is a 32 y.o. male.   Patient has headache fever myalgias nonproductive cough and congestion.  Has had some vomiting related to paroxysms of cough.  Has not had flu shot.  HPI  History reviewed. No pertinent past medical history.  There are no active problems to display for this patient.   History reviewed. No pertinent surgical history.     Home Medications    Prior to Admission medications   Medication Sig Start Date End Date Taking? Authorizing Provider  chlorhexidine (PERIDEX) 0.12 % solution Use as directed 15 mLs in the mouth or throat 2 (two) times daily. 09/11/17   Harlene Salts A, PA-C  ibuprofen (ADVIL,MOTRIN) 200 MG tablet Take 200 mg by mouth every 6 (six) hours as needed.    [provider]  meloxicam (MOBIC) 15 MG tablet Take 1 tablet (15 mg total) by mouth daily. 11/16/12   Reuben Likes, MD  Naproxen Sodium (ALEVE PO) Take by mouth.    [provider]  traMADol (ULTRAM) 50 MG tablet Take 2 tablets (100 mg total) by mouth every 8 (eight) hours as needed. 11/16/12   Reuben Likes, MD    Family History Family History  Problem Relation Age of Onset  . Healthy Mother   . Healthy Father     Social History Social History   Tobacco Use  . Smoking status: Current Every Day Smoker    Packs/day: 1.00    Types: Cigarettes  . Smokeless tobacco: Never Used  Substance Use Topics  . Alcohol use: No    Comment: occ  . Drug use: No     Allergies   Patient has no known allergies.   Review of Systems Review of Systems  Constitutional: Positive for fever.  HENT: Positive for congestion.   Respiratory: Positive for cough.   Cardiovascular: Negative.   Gastrointestinal: Negative.     Genitourinary: Negative.   Musculoskeletal: Negative.   Neurological: Negative.      Physical Exam Triage Vital Signs ED Triage Vitals [11/28/17 1825]  Enc Vitals Group     BP 112/75     Pulse Rate (!) 110     Resp 17     Temp (!) 101.5 F (38.6 C)     Temp Source Oral     SpO2 97 %     Weight      Height      Head Circumference      Peak Flow      Pain Score      Pain Loc      Pain Edu?      Excl. in GC?    No data found.  Updated Vital Signs BP 112/75   Pulse (!) 110   Temp (!) 101.5 F (38.6 C) (Oral)   Resp 17   SpO2 97%   Visual Acuity Right Eye Distance:   Left Eye Distance:   Bilateral Distance:    Right Eye Near:   Left Eye Near:    Bilateral Near:     Physical Exam  Constitutional: He is oriented to person, place, and time. He appears well-developed and well-nourished. He appears ill.  HENT:  Head: Normocephalic.  Mouth/Throat: Oropharynx is clear and moist.  Neck: Normal range of motion.  Cardiovascular: Normal rate, regular rhythm and normal heart sounds.  Pulmonary/Chest: Effort normal and breath sounds normal.  Abdominal: Soft.  Neurological: He is alert and oriented to person, place, and time.  Psychiatric: He has a normal mood and affect.     UC Treatments / Results  Labs (all labs ordered are listed, but only abnormal results are displayed) Labs Reviewed - No data to display  EKG None  Radiology No results found.  Procedures Procedures (including critical care time)  Medications Ordered in UC Medications  acetaminophen (TYLENOL) tablet 650 mg (650 mg Oral Given 11/28/17 1831)    Initial Impression / Assessment and Plan / UC Course  I have reviewed the triage vital signs and the nursing notes.  Pertinent labs & imaging results that were available during my care of the patient were reviewed by me and considered in my medical decision making (see chart for details).     Flulike illness.  Symptoms certainly would fit  for flu.  We will go ahead and treat with Tamiflu and Hycodan for dry cough.  May take Tylenol for fever over 101.  Encourage increased fluid intake Final Clinical Impressions(s) / UC Diagnoses   Final diagnoses:  None   Discharge Instructions   None    ED Prescriptions    None     Controlled Substance Prescriptions [place] Controlled Substance Registry consulted? No   Frederica KusterMiller, Stephen M, MD 11/28/17 [phone]

## 2017-11-28 NOTE — ED Triage Notes (Addendum)
Patient reports generalized bodyaches with headache, vomiting, nasal congestion/drainage since Saturday. Unsure of fever but states it feels like he has one.   Patient has been taking nyquil and dayquil to help with symptoms. States he feels like most of the time he has vomiting after taking. States has been drinking lots of fluids.

## 2019-03-08 ENCOUNTER — Ambulatory Visit (HOSPITAL_COMMUNITY)
Admission: EM | Admit: 2019-03-08 | Discharge: 2019-03-08 | Disposition: A | Discharge status: Home / Self Care | Payer: BC Managed Care – PPO | Attending: Family Medicine | Admitting: Family Medicine

## 2019-03-08 ENCOUNTER — Other Ambulatory Visit: Payer: Self-pay

## 2019-03-08 ENCOUNTER — Encounter (HOSPITAL_COMMUNITY): Payer: Self-pay

## 2019-03-08 DIAGNOSIS — Z20822 Contact with and (suspected) exposure to covid-19: Secondary | ICD-10-CM

## 2019-03-08 DIAGNOSIS — J069 Acute upper respiratory infection, unspecified: Secondary | ICD-10-CM | POA: Insufficient documentation

## 2019-03-08 NOTE — ED Triage Notes (Signed)
Patient presents to Urgent Care with complaints of vomiting four days ago and having a cough since 6 days ago. Patient reports he has not vomited in several days and feels much better but needs a note to return to work.

## 2019-03-08 NOTE — Discharge Instructions (Signed)
Go home to rest Drink plenty of fluids Take Tylenol for pain or fever You may take over-the-counter cough and cold medicines as needed You must quarantine at home until your test result is available You can check for your test result in MyChart  

## 2019-03-08 NOTE — ED Provider Notes (Signed)
[facility]    CSN: [id number] Arrival date & time: 03/08/19  1126      History   Chief Complaint Chief Complaint  Patient presents with  . Emesis    HPI Gary Hale is a 34 y.o. male.   HPI Patient states he has had a cough and some congestion for about 6 days.  The cough is improving.  No fever chills.  No body aches.  No loss of taste sense or smell.  No shortness of breath.  + cigarette smoking.  No underlying lung disease or asthma.  No known exposure to Covid.  He also has had some loose bowels.  No nausea or vomiting.  No abdominal pain.  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  He is here today for Covid testing.  He has not been around anyone with Covid.  No one else in his home is sick. History reviewed. No pertinent past medical history.  There are no problems to display for this patient.   History reviewed. No pertinent surgical history.     Home Medications    Prior to Admission medications   Medication Sig Start Date End Date Taking? Authorizing Provider  ibuprofen (ADVIL,MOTRIN) 200 MG tablet Take 200 mg by mouth every 6 (six) hours as needed.    [provider]    Family History Family History  Problem Relation Age of Onset  . Healthy Mother   . Healthy Father     Social History Social History   Tobacco Use  . Smoking status: Current Every Day Smoker    Packs/day: 0.60    Types: Cigarettes  . Smokeless tobacco: Never Used  Substance Use Topics  . Alcohol use: No    Comment: occ  . Drug use: No     Allergies   Patient has no known allergies.   Review of Systems Review of Systems  Constitutional: Negative for chills, fatigue and fever.  HENT: Positive for congestion.   Respiratory: Positive for cough. Negative for shortness of breath.   Cardiovascular: Negative for chest pain.  Gastrointestinal: Positive for diarrhea. Negative for  nausea and vomiting.  Musculoskeletal: Negative for back pain and myalgias.  Neurological: Negative for headaches.     Physical Exam Triage Vital Signs ED Triage Vitals  Enc Vitals Group     BP 03/08/19 1237 134/79     Pulse Rate 03/08/19 1237 91     Resp 03/08/19 1237 16     Temp 03/08/19 1237 98.5 F (36.9 C)     Temp Source 03/08/19 1237 Oral     SpO2 03/08/19 1237 99 %     Weight --      Height --      Head Circumference --      Peak Flow --      Pain Score 03/08/19 1235 0     Pain Loc --      Pain Edu? --      Excl. in GC? --    No data found.  Updated Vital Signs BP 134/79 (BP Location: Left Arm)   Pulse 91   Temp 98.5 F (36.9 C) (Oral)   Resp 16   SpO2 99%     Physical Exam Constitutional:      General: He is not in acute distress.    Appearance: He is well-developed.     Comments:  Exam by observation  HENT:     Head: Normocephalic and atraumatic.     Mouth/Throat:     Comments: Mask in place Eyes:     Conjunctiva/sclera: Conjunctivae normal.     Pupils: Pupils are equal, round, and reactive to light.  Cardiovascular:     Rate and Rhythm: Normal rate.  Pulmonary:     Effort: Pulmonary effort is normal. No respiratory distress.  Musculoskeletal:        General: Normal range of motion.     Cervical back: Normal range of motion.  Skin:    General: Skin is warm and dry.  Neurological:     Mental Status: He is alert.  Psychiatric:        Mood and Affect: Mood normal.        Behavior: Behavior normal.      UC Treatments / Results  Labs (all labs ordered are listed, but only abnormal results are displayed) Labs Reviewed  NOVEL CORONAVIRUS, NAA (HOSP ORDER, SEND-OUT TO REF LAB; TAT 18-24 HRS)    EKG   Radiology No results found.  Procedures Procedures (including critical care time)  Medications Ordered in UC Medications - No data to display  Initial Impression / Assessment and Plan / UC Course  I have reviewed the triage vital  signs and the nursing notes.  Pertinent labs & imaging results that were available during my care of the patient were reviewed by me and considered in my medical decision making (see chart for details).     Importance of Quarantine Pending Covid test result is emphasized Final Clinical Impressions(s) / UC Diagnoses   Final diagnoses:  Viral URI with cough  Suspected COVID-19 virus infection     Discharge Instructions     Go home to rest Drink plenty of fluids Take Tylenol for pain or fever You may take over-the-counter cough and cold medicines as needed You must quarantine at home until your test result is available You can check for your test result in MyChart    ED Prescriptions    None     PDMP not reviewed this encounter.   Raylene Everts, MD 03/08/19 [phone]

## 2019-03-10 LAB — NOVEL CORONAVIRUS, NAA (HOSP ORDER, SEND-OUT TO REF LAB; TAT 18-24 HRS): SARS-CoV-2, NAA: NOT DETECTED

## 2021-03-04 ENCOUNTER — Emergency Department (HOSPITAL_COMMUNITY)
Admission: EM | Admit: 2021-03-04 | Discharge: 2021-03-04 | Disposition: A | Discharge status: Home / Self Care | Payer: Self-pay | Attending: Emergency Medicine | Admitting: Emergency Medicine

## 2021-03-04 ENCOUNTER — Emergency Department (HOSPITAL_COMMUNITY): Payer: Self-pay

## 2021-03-04 DIAGNOSIS — M2420 Disorder of ligament, unspecified site: Secondary | ICD-10-CM

## 2021-03-04 DIAGNOSIS — R070 Pain in throat: Secondary | ICD-10-CM | POA: Insufficient documentation

## 2021-03-04 DIAGNOSIS — Z20822 Contact with and (suspected) exposure to covid-19: Secondary | ICD-10-CM | POA: Insufficient documentation

## 2021-03-04 DIAGNOSIS — M8588 Other specified disorders of bone density and structure, other site: Secondary | ICD-10-CM | POA: Insufficient documentation

## 2021-03-04 LAB — RESP PANEL BY RT-PCR (FLU A&B, COVID) ARPGX2
Influenza A by PCR: NEGATIVE
Influenza B by PCR: NEGATIVE
SARS Coronavirus 2 by RT PCR: NEGATIVE

## 2021-03-04 LAB — GROUP A STREP BY PCR: Group A Strep by PCR: NOT DETECTED

## 2021-03-04 LAB — I-STAT CREATININE, ED: Creatinine, Ser: 1.2 mg/dL (ref 0.61–1.24)

## 2021-03-04 MED ORDER — IBUPROFEN 800 MG PO TABS: 800.0000 mg | ORAL_TABLET | Freq: Three times a day (TID) | ORAL | 0 refills | Status: DC

## 2021-03-04 MED ORDER — OXYCODONE-ACETAMINOPHEN 5-325 MG PO TABS: 1.0000 | ORAL_TABLET | Freq: Four times a day (QID) | ORAL | 0 refills | Status: DC | PRN

## 2021-03-04 MED ORDER — IOHEXOL 300 MG/ML  SOLN
75.0000 mL | Freq: Once | INTRAMUSCULAR | Status: AC | PRN
  Administered 2021-03-04: 75 mL via INTRAVENOUS

## 2021-03-04 MED ORDER — METHYLPREDNISOLONE SODIUM SUCC 125 MG IJ SOLR
125.0000 mg | Freq: Once | INTRAMUSCULAR | Status: AC
  Administered 2021-03-04: 125 mg via INTRAVENOUS
  Filled 2021-03-04: qty 2

## 2021-03-04 MED ORDER — PREDNISONE 20 MG PO TABS: 40.0000 mg | ORAL_TABLET | Freq: Every day | ORAL | 0 refills | Status: AC

## 2021-03-04 NOTE — ED Triage Notes (Signed)
Pt states his left ear has been hurting for the past 3 days. States now the left side of his throat is sore.

## 2021-03-04 NOTE — ED Provider Notes (Signed)
[facility]EMERGENCY [facility]PT Provider Note   CSN: [id number] Arrival date & time: 03/04/21  0831     History  Chief Complaint  Patient presents with   Otalgia    Gary Hale is a 36 y.o. male who presents to the ED for unilateral left-sided sore throat and ear pain that started 3 days ago.  Pain is worsened significantly upon swallowing.  He believes that he has an ear infection.  He has been using OTC pain medications without relief.  He denies fevers, chills, congestion, cough, chest pain and shortness of breath.  Negative GI symptoms.   Otalgia     Home Medications Prior to Admission medications   Medication Sig Start Date End Date Taking? Authorizing Provider  ibuprofen (ADVIL) 800 MG tablet Take 1 tablet (800 mg total) by mouth 3 (three) times daily. 03/04/21  Yes Janell Quiet, PA-C  oxyCODONE-acetaminophen (PERCOCET/ROXICET) 5-325 MG tablet Take 1 tablet by mouth every 6 (six) hours as needed for severe pain. 03/04/21  Yes Raynald Blend R, PA-C  predniSONE (DELTASONE) 20 MG tablet Take 2 tablets (40 mg total) by mouth daily for 5 days. 03/05/21 03/10/21 Yes Raynald Blend R, PA-C  ibuprofen (ADVIL,MOTRIN) 200 MG tablet Take 200 mg by mouth every 6 (six) hours as needed.    [provider]      Allergies    Patient has no known allergies.    Review of Systems   Review of Systems  HENT:  Positive for ear pain.    Physical Exam Updated Vital Signs BP 123/86 (BP Location: Left Arm)    Pulse 69    Temp 98.8 F (37.1 C) (Oral)    Resp 16    Ht 5\' 6"  (1.676 m)    Wt 88.5 kg    SpO2 100%    BMI 31.47 kg/m  Physical Exam Vitals and nursing note reviewed.  Constitutional:      General: He is not in acute distress.    Appearance: He is not ill-appearing.  HENT:     Head: Atraumatic.     Right Ear: Tympanic membrane normal.     Left Ear: Tympanic membrane normal.     Ears:     Comments: Left EAC with scabbing consistent with traumatic  injury, likely Q-tips.  Tympanic membrane normal without erythema or bulging.  Visible bony landmarks.  Negative mastoid tenderness, negative tragus, negative ear tug  Right ear and TM and normal.    Mouth/Throat:     Comments: Throat difficult to fully visualize given patient's class III/class IV Mallampati score.  Uvula appears midline.  No obvious erythema, exudate or unilateral swelling. Eyes:     Conjunctiva/sclera: Conjunctivae normal.  Neck:      Comments: Tender palpation of the left second mandibular region.  No palpable lymph nodes or unilateral swelling. Cardiovascular:     Rate and Rhythm: Normal rate and regular rhythm.     Pulses: Normal pulses.     Heart sounds: No murmur heard. Pulmonary:     Effort: Pulmonary effort is normal. No respiratory distress.     Breath sounds: Normal breath sounds.  Abdominal:     General: Abdomen is flat. There is no distension.     Palpations: Abdomen is soft.     Tenderness: There is no abdominal tenderness.  Musculoskeletal:        General: Normal range of motion.     Cervical back: Normal range of motion.  Skin:  General: Skin is warm and dry.     Capillary Refill: Capillary refill takes less than 2 seconds.  Neurological:     General: No focal deficit present.     Mental Status: He is alert.  Psychiatric:        Mood and Affect: Mood normal.    ED Results / Procedures / Treatments   Labs (all labs ordered are listed, but only abnormal results are displayed) Labs Reviewed  GROUP A STREP BY PCR  RESP PANEL BY RT-PCR (FLU A&B, COVID) [id number]  I-STAT CREATININE, ED    EKG None  Radiology CT Soft Tissue Neck W Contrast  Result Date: 03/04/2021 CLINICAL DATA:  Unilateral throat pain with left ear pain for 3 days EXAM: CT NECK WITH CONTRAST TECHNIQUE: Multidetector CT imaging of the neck was performed using the standard protocol following the bolus administration of intravenous contrast. RADIATION DOSE REDUCTION: This exam  was performed according to the departmental dose-optimization program which includes automated exposure control, adjustment of the mA and/or kV according to patient size and/or use of iterative reconstruction technique. CONTRAST:  62mL OMNIPAQUE IOHEXOL 300 MG/ML  SOLN COMPARISON:  None. FINDINGS: Pharynx and larynx: No visible mucosal based enhancement or mass. Salivary glands: No inflammation, mass, or stone. Thyroid: 2 nodules are seen in the right lobe, measuring up to 7 mm. No followup recommended (ref: J Am Coll Radiol. 2015 Feb;12(2): 143-50). Lymph nodes: No worrisome nodal enlargement. Vascular: Negative. Limited intracranial: Negative. Visualized orbits: Negative. Mastoids and visualized paranasal sinuses: Clear. Skeleton: Extensive stylohyoid ligament ossification essentially reaching the hyoid with asymmetric left-sided thickening. No adjacent soft tissue inflammation is visible. Upper chest: Negative IMPRESSION: 1. No acute finding. 2. 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 Electronically Signed   By: Tiburcio Pea M.D.   On: 03/04/2021 10:31    Procedures Procedures    Medications Ordered in ED Medications  iohexol (OMNIPAQUE) 300 MG/ML solution 75 mL (75 mLs Intravenous Contrast Given 03/04/21 1011)  methylPREDNISolone sodium succinate (SOLU-MEDROL) 125 mg/2 mL injection 125 mg (125 mg Intravenous Given 03/04/21 1111)    ED Course/ Medical Decision Making/ A&P Clinical Course as of 03/04/21 1459  Wed Mar 04, 2021  1034 CT Soft Tissue Neck W Contrast [EC]    Clinical Course User Index [EC] Janell Quiet, PA-C                           Medical Decision Making Amount and/or Complexity of Data Reviewed Radiology: ordered. Decision-making details documented in ED Course.  Risk Prescription drug management.   History:  Per HPI Social determinants of health: Patient without PCP,  however outpatient referral for ENT was provided  Initial impression:  This patient presents to the ED for concern of left sided ear and throat pain, this involves an extensive number of treatment options, and is a complaint that carries with it a high risk of complications and morbidity.   Differentials include acute otitis media, strep throat, COVID, flu, epiglottitis, peritonsillar abscess This is an overall well-appearing 36 year old male in no acute distress, nontoxic-appearing.  He does seem uncomfortable while attempting to talk due to pain.  Vitals are normal, airway intact.  Left ear exam significant only for traumatic injury to the [facility].  This does not seem to be source of patient's pain.  Will obtain strep, COVID/flu panel, CT neck to rule out possible peritonsillar  abscess.  Lab Tests and EKG:  I Ordered, reviewed, and interpreted labs and EKG.  The pertinent results include:  Negative strep, COVID and flu   Imaging Studies ordered:  I ordered imaging studies including  CT soft tissue neck which shows calcification of the stylohyoid ligament on the left side, consistent with Eagle syndrome I independently visualized and interpreted imaging and I agree with the radiologist interpretation.     Medicines ordered and prescription drug management:  I ordered medication including: Solu-Medrol 125 mg for you will confirm Reevaluation of the patient after these medicines showed that the patient improved I have reviewed the patients home medicines and have made adjustments as needed  Consultations Obtained:  I requested consultation with Dr. Jearld Fenton with ENT,  and discussed lab and imaging findings as well as pertinent plan.  Dr. Jearld Fenton recommends that this condition can be treated outpatient, either with his PCP or with him at ENT.  Generally flareups of the ossification of the stylohyoid ligament can be managed with NSAIDs such as ibuprofen along with some pain management and steroids.   He should follow-up with ENT or primary care provider.  If this becomes a frequent issue for him, he can have a surgical clipping of the ligament to prevent flareups.   Disposition:  After consideration of the diagnostic results, physical exam, history and the patients response to treatment feel that the patent would benefit from discharge with outpatient follow-up.   Eagle syndrome: Patient was discharged home with oral course of steroids and conservative measures including ibuprofen.  I also gave him a short course of pain medication given how much significant pain he was in on exam.  Solu-Medrol given here in emergency department.  Outpatient ENT referral was provided.  All questions were asked and answered.  Patient was discharged home in good condition.    Final Clinical Impression(s) / ED Diagnoses Final diagnoses:  Eagle's syndrome    Rx / DC Orders ED Discharge Orders          Ordered    predniSONE (DELTASONE) 20 MG tablet  Daily        03/04/21 1107    ibuprofen (ADVIL) 800 MG tablet  3 times daily        03/04/21 1107    oxyCODONE-acetaminophen (PERCOCET/ROXICET) 5-325 MG tablet  Every 6 hours PRN        03/04/21 1107              Janell Quiet, PA-C 03/04/21 1504    Ernie Avena, MD 03/04/21 [phone]

## 2021-03-04 NOTE — Discharge Instructions (Addendum)
You were negative today for strep, covid and flu. Your CT shows that your stylohyoid ligament is calcified which is causing compression of the nerve that lays near the bone which is causing your pain.  Have given you a first dose of steroids here in the emergency department and you will take an oral course for 5 days starting tomorrow.  You can treat this at home with ibuprofen for inflammation and pain.  Have also sent you home a few tablets of pain medication until this improves.  If your pain does not improve over the next several days, please follow-up with your PCP or the provided ENT referral above.

## 2022-01-10 ENCOUNTER — Ambulatory Visit (HOSPITAL_COMMUNITY)
Admission: EM | Admit: 2022-01-10 | Discharge: 2022-01-10 | Disposition: A | Discharge status: Home / Self Care | Payer: Managed Care, Other (non HMO) | Attending: Family Medicine | Admitting: Family Medicine

## 2022-01-10 DIAGNOSIS — Z1152 Encounter for screening for COVID-19: Secondary | ICD-10-CM | POA: Diagnosis not present

## 2022-01-10 DIAGNOSIS — J069 Acute upper respiratory infection, unspecified: Secondary | ICD-10-CM

## 2022-01-10 MED ORDER — IBUPROFEN 800 MG PO TABS: 800.0000 mg | ORAL_TABLET | Freq: Three times a day (TID) | ORAL | 0 refills | Status: DC

## 2022-01-10 MED ORDER — BENZONATATE 100 MG PO CAPS: 100.0000 mg | ORAL_CAPSULE | Freq: Three times a day (TID) | ORAL | 0 refills | Status: DC | PRN

## 2022-01-10 NOTE — ED Triage Notes (Signed)
Pt reports cough and congestion x 2-3 days. He reports headache. Pt is taking OTC sinus and congestion.

## 2022-01-10 NOTE — Discharge Instructions (Signed)
Take benzonatate 100 mg, 1 tab every 8 hours as needed for cough.  Take ibuprofen 800 mg--1 tab every 8 hours as needed for pain.   You have been swabbed for COVID, and the test will result in the next 24 hours. Our staff will call you if positive. If the COVID test is positive, you should quarantine for 5 days from the start of your symptoms

## 2022-01-10 NOTE — ED Provider Notes (Addendum)
[facility]    CSN: [id number] Arrival date & time: 01/10/22  1443      History   Chief Complaint Chief Complaint  Patient presents with   Nasal Congestion   Cough   Headache    HPI Gary Hale is a 36 y.o. male.    Cough Associated symptoms: headaches   Headache Associated symptoms: cough    Here for a 3-day history of cough and rhinorrhea and congestion.  He is having some headache also.  No fever.  No vomiting or diarrhea.  No past medical history on file.  There are no problems to display for this patient.   No past surgical history on file.     Home Medications    Prior to Admission medications   Medication Sig Start Date End Date Taking? Authorizing Provider  benzonatate (TESSALON) 100 MG capsule Take 1 capsule (100 mg total) by mouth 3 (three) times daily as needed for cough. 01/10/22  Yes Zenia Resides, MD  ibuprofen (ADVIL) 800 MG tablet Take 1 tablet (800 mg total) by mouth 3 (three) times daily. 01/10/22   Zenia Resides, MD    Family History Family History  Problem Relation Age of Onset   Healthy Mother    Healthy Father     Social History Social History   Tobacco Use   Smoking status: Every Day    Packs/day: 0.60    Types: Cigarettes   Smokeless tobacco: Never  Vaping Use   Vaping Use: Never used  Substance Use Topics   Alcohol use: No    Comment: occ   Drug use: No     Allergies   Patient has no known allergies.   Review of Systems Review of Systems  Respiratory:  Positive for cough.   Neurological:  Positive for headaches.     Physical Exam Triage Vital Signs ED Triage Vitals [01/10/22 1707]  Enc Vitals Group     BP 124/74     Pulse Rate 78     Resp 18     Temp 97.7 F (36.5 C)     Temp Source Oral     SpO2 98 %     Weight      Height      Head Circumference      Peak Flow      Pain Score      Pain Loc      Pain Edu?      Excl. in GC?    No data found.  Updated Vital Signs BP  124/74 (BP Location: Left Arm)   Pulse 78   Temp 97.7 F (36.5 C) (Oral)   Resp 18   SpO2 98%   Visual Acuity Right Eye Distance:   Left Eye Distance:   Bilateral Distance:    Right Eye Near:   Left Eye Near:    Bilateral Near:     Physical Exam Vitals reviewed.  Constitutional:      General: He is not in acute distress.    Appearance: He is not ill-appearing, toxic-appearing or diaphoretic.  HENT:     Right Ear: Tympanic membrane and ear canal normal.     Left Ear: Tympanic membrane and ear canal normal.     Nose: Nose normal.     Mouth/Throat:     Mouth: Mucous membranes are moist.     Pharynx: No oropharyngeal exudate or posterior oropharyngeal erythema.  Eyes:     Extraocular Movements: Extraocular movements intact.  Conjunctiva/sclera: Conjunctivae normal.     Pupils: Pupils are equal, round, and reactive to light.  Cardiovascular:     Rate and Rhythm: Normal rate and regular rhythm.     Heart sounds: No murmur heard. Pulmonary:     Effort: No respiratory distress.     Breath sounds: No stridor. No wheezing, rhonchi or rales.  Musculoskeletal:     Cervical back: Neck supple.  Lymphadenopathy:     Cervical: No cervical adenopathy.  Skin:    Capillary Refill: Capillary refill takes less than 2 seconds.     Coloration: Skin is not jaundiced or pale.  Neurological:     General: No focal deficit present.     Mental Status: He is alert and oriented to person, place, and time.  Psychiatric:        Behavior: Behavior normal.      UC Treatments / Results  Labs (all labs ordered are listed, but only abnormal results are displayed) Labs Reviewed  SARS CORONAVIRUS 2 (TAT 6-24 HRS)    EKG   Radiology No results found.  Procedures Procedures (including critical care time)  Medications Ordered in UC Medications - No data to display  Initial Impression / Assessment and Plan / UC Course  I have reviewed the triage vital signs and the nursing  notes.  Pertinent labs & imaging results that were available during my care of the patient were reviewed by me and considered in my medical decision making (see chart for details).       Discussed with him that he may have a viral upper respiratory infection such as RSV.  He is swabbed for COVID, and if positive he would be a candidate for Paxlovid.  His creatinine in February of this year was 1.2. Tessalon Perles and ibuprofen are sent in for his symptoms. Final Clinical Impressions(s) / UC Diagnoses   Final diagnoses:  Viral upper respiratory tract infection     Discharge Instructions      Take benzonatate 100 mg, 1 tab every 8 hours as needed for cough.  Take ibuprofen 800 mg--1 tab every 8 hours as needed for pain.   You have been swabbed for COVID, and the test will result in the next 24 hours. Our staff will call you if positive. If the COVID test is positive, you should quarantine for 5 days from the start of your symptoms       ED Prescriptions     Medication Sig Dispense Auth. Provider   ibuprofen (ADVIL) 800 MG tablet Take 1 tablet (800 mg total) by mouth 3 (three) times daily. 21 tablet Fernando Torry, Janace Aris, MD   benzonatate (TESSALON) 100 MG capsule Take 1 capsule (100 mg total) by mouth 3 (three) times daily as needed for cough. 21 capsule Zenia Resides, MD      PDMP not reviewed this encounter.   Zenia Resides, MD 01/10/22 1718    Zenia Resides, MD 01/10/22 [phone]

## 2022-01-11 LAB — SARS CORONAVIRUS 2 (TAT 6-24 HRS): SARS Coronavirus 2: NEGATIVE

## 2022-08-06 ENCOUNTER — Ambulatory Visit
Admission: RE | Admit: 2022-08-06 | Discharge: 2022-08-06 | Disposition: A | Discharge status: Home / Self Care | Payer: Managed Care, Other (non HMO) | Source: Ambulatory Visit | Attending: Emergency Medicine | Admitting: Emergency Medicine

## 2022-08-06 VITALS — BP 150/85 | HR 84 | Temp 98.9°F | Resp 16

## 2022-08-06 DIAGNOSIS — J4521 Mild intermittent asthma with (acute) exacerbation: Secondary | ICD-10-CM

## 2022-08-06 DIAGNOSIS — F17210 Nicotine dependence, cigarettes, uncomplicated: Secondary | ICD-10-CM

## 2022-08-06 MED ORDER — LEVOCETIRIZINE DIHYDROCHLORIDE 5 MG PO TABS: 5.0000 mg | ORAL_TABLET | Freq: Every evening | ORAL | 2 refills | Status: AC

## 2022-08-06 MED ORDER — STIOLTO RESPIMAT 2.5-2.5 MCG/ACT IN AERS: 2.0000 | INHALATION_SPRAY | Freq: Every day | RESPIRATORY_TRACT | 2 refills | Status: AC

## 2022-08-06 MED ORDER — METHYLPREDNISOLONE 4 MG PO TBPK: ORAL_TABLET | ORAL | 0 refills | Status: DC

## 2022-08-06 MED ORDER — ALBUTEROL SULFATE HFA 108 (90 BASE) MCG/ACT IN AERS: 2.0000 | INHALATION_SPRAY | Freq: Four times a day (QID) | RESPIRATORY_TRACT | 2 refills | Status: DC | PRN

## 2022-08-06 MED ORDER — IPRATROPIUM-ALBUTEROL 0.5-2.5 (3) MG/3ML IN SOLN
3.0000 mL | Freq: Once | RESPIRATORY_TRACT | Status: AC
  Administered 2022-08-06: 3 mL via RESPIRATORY_TRACT

## 2022-08-06 NOTE — Discharge Instructions (Addendum)
Your symptoms and my physical exam findings are concerning for reactive airway disease with an acute exacerbation as well as exacerbation of your underlying allergies.  Reactive airways disease is when your lower airway becomes inflamed due to viral infection, bacterial infection or allergy exposure.  Please read below to learn more about the medications, dosages and frequencies that I recommend to help alleviate your symptoms and to get you feeling better soon:   Medrol (methylprednisolone): This is a steroid that will significantly calm your upper and lower airways.  Please take the daily recommended quantity of tablets daily with your breakfast meal starting tomorrow morning until the prescription is complete.     Stiolto (tiotropium and olodaterol): This inhaled maintenance medication contains a Speckman acting beta agonist bronchodilator and a muscarinic bronchodilator.  This medication works on the smooth muscle that opens and constricts of your airways by relaxing the muscle.  The result of relaxation of the smooth muscle is increased air movement and improved work of breathing.  Please inhale 2 puffs every morning to keep your lungs calm and your breathing well-controlled.   ProAir, Ventolin, Proventil (albuterol): This inhaled medication contains a short acting beta agonist bronchodilator.  This medication works on the smooth muscle that opens and constricts of your airways by relaxing the muscle.  The result of relaxation of the smooth muscle is increased air movement and improved work of breathing.  This is a short acting medication that can be used every 4-6 hours as needed for increased work of breathing, shortness of breath, wheezing and excessive coughing.     Xyzal (levocetirizine): This is an excellent second-generation antihistamine that helps to reduce respiratory inflammatory response to environmental allergens.  In some patients, this medication can cause daytime sleepiness so I recommend  that you take 1 tablet daily at bedtime.      Please follow-up within the next 5-7 days either with your primary care provider or urgent care if your symptoms do not resolve.  If you do not have a primary care provider, we will assist you in finding one.   Thank you for visiting urgent care today.  We appreciate the opportunity to participate in your care.

## 2022-08-06 NOTE — ED Triage Notes (Signed)
Pt presents with c/o a dry cough that started last night. 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 Denies other sxs.

## 2022-08-06 NOTE — ED Provider Notes (Signed)
[facility]    CSN: [id number] Arrival date & time: 08/06/22  1608    HISTORY   Chief Complaint  Patient presents with   Cough    Congestion - Entered by patient   HPI Gary Hale is a pleasant, 37 y.o. male who presents to urgent care today. Patient complains of a dry cough that began last night.  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  Patient denies fever, body aches, chills, runny nose, headache, nausea, vomiting, diarrhea, known sick contacts.  Patient reports a history of asthma when he was younger.  Patient states he is a current everyday smoker of cigarettes.  Patient states he has not tried any medications to alleviate his symptoms and is here today because he "wants to get ahead of this cough".  The history is provided by the patient.   History reviewed. No pertinent past medical history. There are no problems to display for this patient.  History reviewed. No pertinent surgical history.  Home Medications    Prior to Admission medications   Not on File    Family History Family History  Problem Relation Age of Onset   Healthy Mother    Healthy Father    Social History Social History   Tobacco Use   Smoking status: Every Day    Current packs/day: 0.60    Types: Cigarettes   Smokeless tobacco: Never  Vaping Use   Vaping status: Never Used  Substance Use Topics   Alcohol use: No    Comment: occ   Drug use: No   Allergies   Patient has no known allergies.  Review of Systems Review of Systems Pertinent findings revealed after performing a 14 point review of systems has been noted in the history of present illness.  Physical Exam Vital Signs BP (!) 150/85 (BP Location: Left Arm)   Pulse 84   Temp 98.9 F (37.2 C) (Oral)   Resp 16   SpO2 98%   No data found.  Physical Exam HENT:     Right Ear: External ear normal. A middle ear effusion is present. Tympanic membrane is bulging. Tympanic membrane is not  injected or erythematous.     Left Ear: External ear normal. A middle ear effusion is present. Tympanic membrane is bulging. Tympanic membrane is not injected or erythematous.     Ears:     Comments: Bilateral EACs normal, both TMs bulging with clear fluid    Nose: Rhinorrhea present. No nasal deformity, septal deviation, signs of injury or nasal tenderness. Rhinorrhea is clear.     Right Nostril: Occlusion present. No foreign body, epistaxis or septal hematoma.     Left Nostril: Occlusion present. No foreign body, epistaxis or septal hematoma.     Right Turbinates: Enlarged, swollen and pale.     Left Turbinates: Enlarged, swollen and pale.     Right Sinus: No maxillary sinus tenderness or frontal sinus tenderness.     Left Sinus: No maxillary sinus tenderness or frontal sinus tenderness.     Mouth/Throat:     Comments: Postnasal drip Pulmonary:     Effort: Prolonged expiration (With end expiration cough after each exhale) present. No tachypnea, bradypnea, accessory muscle usage, respiratory distress or retractions.     Breath sounds: Decreased breath sounds present. No wheezing, rhonchi or rales.     Comments: Repeat auscultation post DuoNeb treatment revealed improved work of breathing, resolution of cough    Visual Acuity  Right Eye Distance:   Left Eye Distance:   Bilateral Distance:    Right Eye Near:   Left Eye Near:    Bilateral Near:     UC Couse / Diagnostics / Procedures:     Radiology No results found.  Procedures Procedures (including critical care time) EKG  Pending results:  Labs Reviewed - No data to display  Medications Ordered in UC: Medications  ipratropium-albuterol (DUONEB) 0.5-2.5 (3) MG/3ML nebulizer solution 3 mL (3 mLs Nebulization Given 08/06/22 1638)    UC Diagnoses / Final Clinical Impressions(s)   I have reviewed the triage vital signs and the nursing notes.  Pertinent labs & imaging results that were available during my care of the patient  were reviewed by me and considered in my medical decision making (see chart for details).    Final diagnoses:  Mild intermittent asthma with allergic rhinitis with acute exacerbation  Tobacco dependence due to cigarettes   Patient encouraged to quit smoking.  Patient provided with Stiolto given his robust response to DuoNeb treatment and advised to inhale 2 puffs daily every morning.  Patient provided with an albuterol inhaler for rescue of shortness of breath and cough despite Stiolto treatment.  Patient advised to begin Xyzal for allergy findings on physical exam.  Conservative care recommended.  Return precaution advised.  Please see discharge instructions below for details of plan of care as provided to patient. ED Prescriptions     Medication Sig Dispense Auth. Provider   albuterol (VENTOLIN HFA) 108 (90 Base) MCG/ACT inhaler Inhale 2 puffs into the lungs every 6 (six) hours as needed for wheezing or shortness of breath (Cough). 20.1 g Theadora Rama Scales, PA-C   levocetirizine (XYZAL) 5 MG tablet Take 1 tablet (5 mg total) by mouth every evening. 30 each Theadora Rama Scales, PA-C   Tiotropium Bromide-Olodaterol (STIOLTO RESPIMAT) 2.5-2.5 MCG/ACT AERS Inhale 2 puffs into the lungs daily. 4 g Theadora Rama Scales, PA-C   methylPREDNISolone (MEDROL DOSEPAK) 4 MG TBPK tablet Take 24 mg on day 1, 20 mg on day 2, 16 mg on day 3, 12 mg on day 4, 8 mg on day 5, 4 mg on day 6.  Take all tablets in each row at once, do not spread tablets out throughout the day. 21 tablet Theadora Rama Scales, PA-C      PDMP not reviewed this encounter.  Pending results:  Labs Reviewed - No data to display  Discharge Instructions:   Discharge Instructions      Your symptoms and my physical exam findings are concerning for reactive airway disease with an acute exacerbation as well as exacerbation of your underlying allergies.  Reactive airways disease is when your lower airway becomes  inflamed due to viral infection, bacterial infection or allergy exposure.  Please read below to learn more about the medications, dosages and frequencies that I recommend to help alleviate your symptoms and to get you feeling better soon:   Medrol (methylprednisolone): This is a steroid that will significantly calm your upper and lower airways.  Please take the daily recommended quantity of tablets daily with your breakfast meal starting tomorrow morning until the prescription is complete.     Stiolto (tiotropium and olodaterol): This inhaled maintenance medication contains a Meske acting beta agonist bronchodilator and a muscarinic bronchodilator.  This medication works on the smooth muscle that opens and constricts of your airways by relaxing the muscle.  The result of relaxation of the smooth muscle is increased air movement and improved  work of breathing.  Please inhale 2 puffs every morning to keep your lungs calm and your breathing well-controlled.   ProAir, Ventolin, Proventil (albuterol): This inhaled medication contains a short acting beta agonist bronchodilator.  This medication works on the smooth muscle that opens and constricts of your airways by relaxing the muscle.  The result of relaxation of the smooth muscle is increased air movement and improved work of breathing.  This is a short acting medication that can be used every 4-6 hours as needed for increased work of breathing, shortness of breath, wheezing and excessive coughing.     Xyzal (levocetirizine): This is an excellent second-generation antihistamine that helps to reduce respiratory inflammatory response to environmental allergens.  In some patients, this medication can cause daytime sleepiness so I recommend that you take 1 tablet daily at bedtime.      Please follow-up within the next 5-7 days either with your primary care provider or urgent care if your symptoms do not resolve.  If you do not have a primary care provider, we  will assist you in finding one.   Thank you for visiting urgent care today.  We appreciate the opportunity to participate in your care.       Disposition Upon Discharge:  Condition: stable for discharge home  Patient presented with an acute illness with associated systemic symptoms and significant discomfort requiring urgent management. In my opinion, this is a condition that a prudent lay person (someone who possesses an average knowledge of health and medicine) may potentially expect to result in complications if not addressed urgently such as respiratory distress, impairment of bodily function or dysfunction of bodily organs.   Routine symptom specific, illness specific and/or disease specific instructions were discussed with the patient and/or caregiver at length.   As such, the patient has been evaluated and assessed, work-up was performed and treatment was provided in alignment with urgent care protocols and evidence based medicine.  Patient/parent/caregiver has been advised that the patient may require follow up for further testing and treatment if the symptoms continue in spite of treatment, as clinically indicated and appropriate.  Patient/parent/caregiver has been advised to return to the [facility] or PCP if no better; to PCP or the Emergency Department if new signs and symptoms develop, or if the current signs or symptoms continue to change or worsen for further workup, evaluation and treatment as clinically indicated and appropriate  The patient will follow up with their current PCP if and as advised. If the patient does not currently have a PCP we will assist them in obtaining one.   The patient may need specialty follow up if the symptoms continue, in spite of conservative treatment and management, for further workup, evaluation, consultation and treatment as clinically indicated and appropriate.  Patient/parent/caregiver verbalized understanding and agreement of plan as discussed.   All questions were addressed during visit.  Please see discharge instructions below for further details of plan.  This office note has been dictated using Teaching laboratory technician.  Unfortunately, this method of dictation can sometimes lead to typographical or grammatical errors.  I apologize for your inconvenience in advance if this occurs.  Please do not hesitate to reach out to me if clarification is needed.      Theadora Rama Scales, [place] 08/06/22 [phone]

## 2022-09-24 IMAGING — CT CT NECK W/ CM
3 of 4 series · 12 of 33 positions shown, 14 images · IV contrast (agent unspecified)
Comparison: None.

CLINICAL DATA: Unilateral throat pain with left ear pain for 3 days

EXAM:
CT NECK WITH CONTRAST
TECHNIQUE: Multidetector CT imaging of the neck was performed using the
standard protocol following the bolus administration of intravenous
contrast.

[Series 5: axial · axial · 0.35mm/px · z∈[-325,-113]mm · 4 of 157 slices shown, 5 images]
[im 23/157  soft-tissue]
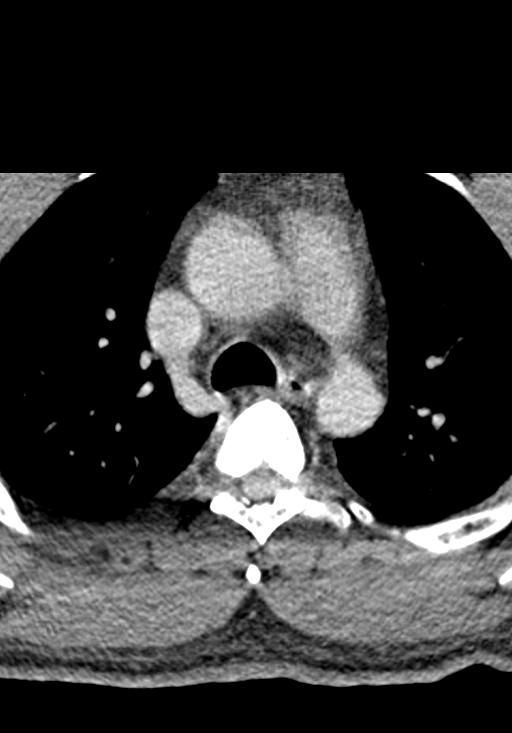
[im 23/157  bone]
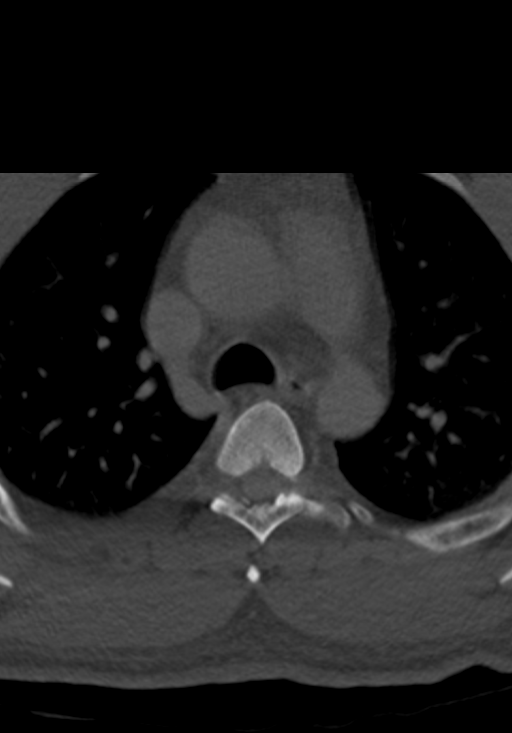
[im 67/157  bone]
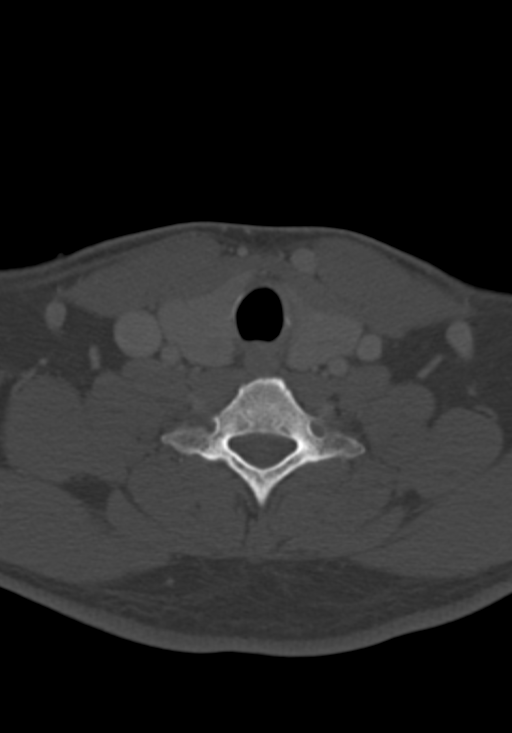
[im 90/157  bone]
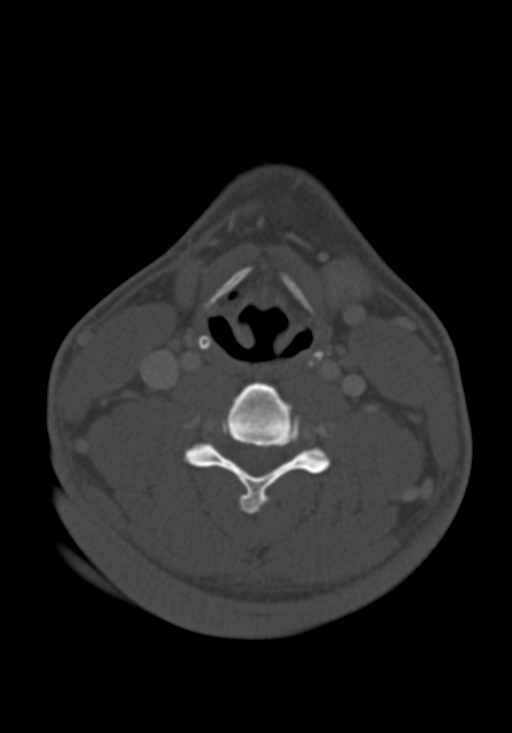
[im 134/157  bone]
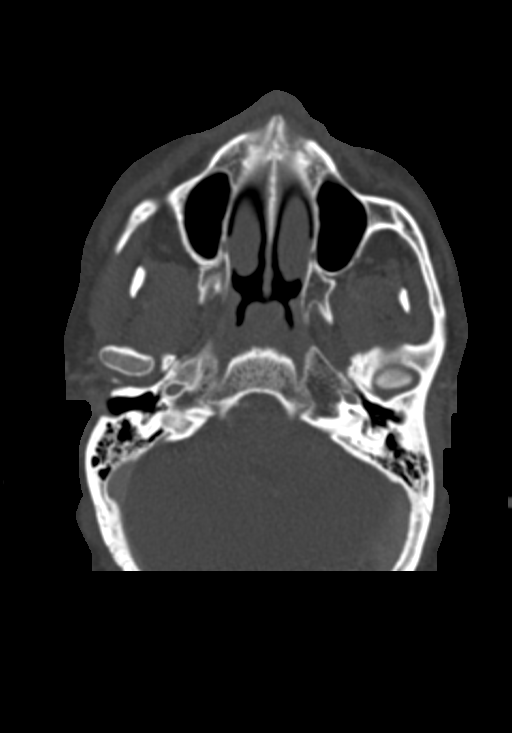

[Series 6: coronal · coronal · 0.35mm/px · 3 of 130 slices shown]
[im 40/130  bone]
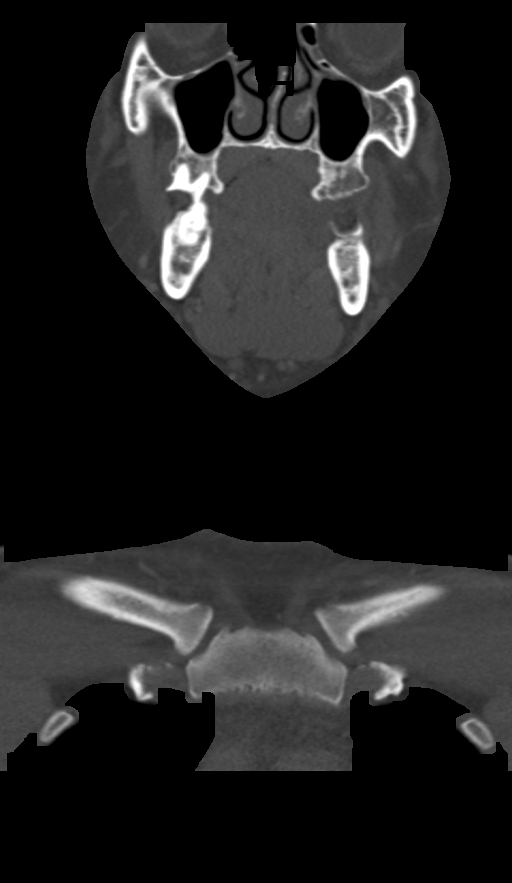
[im 57/130  bone]
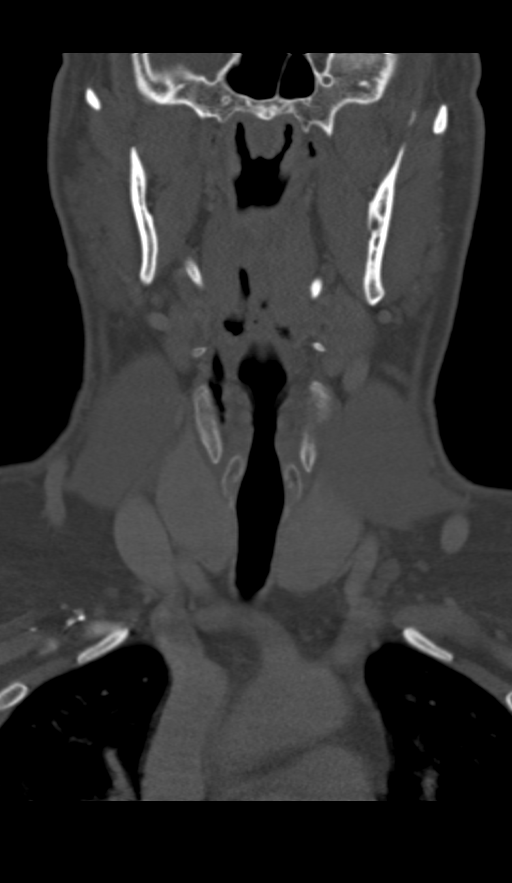
[im 73/130  bone]
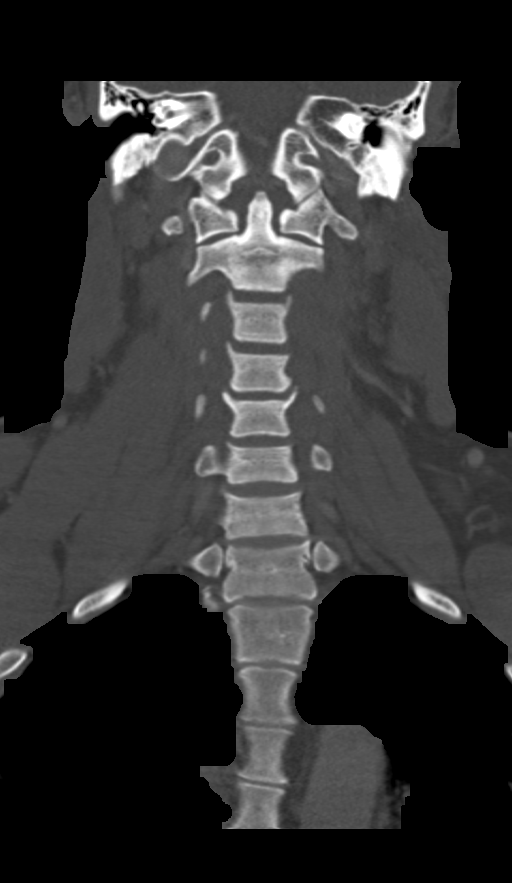

[Series 7: sagittal · sagittal · 0.51mm/px · 5 of 91 slices shown, 6 images]
[im 31/91  bone]
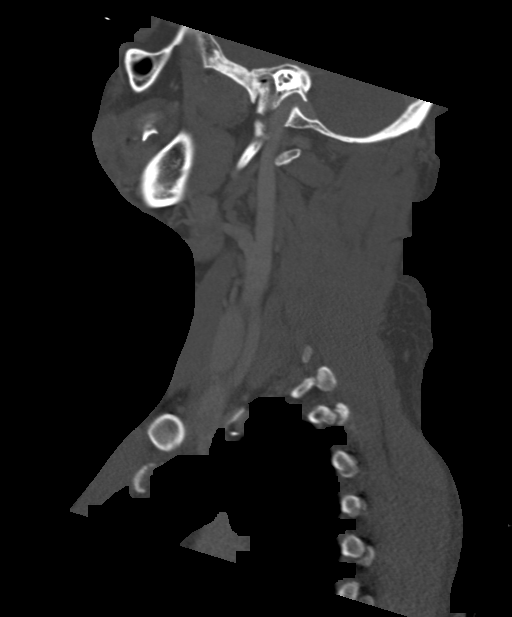
[im 38/91  bone]
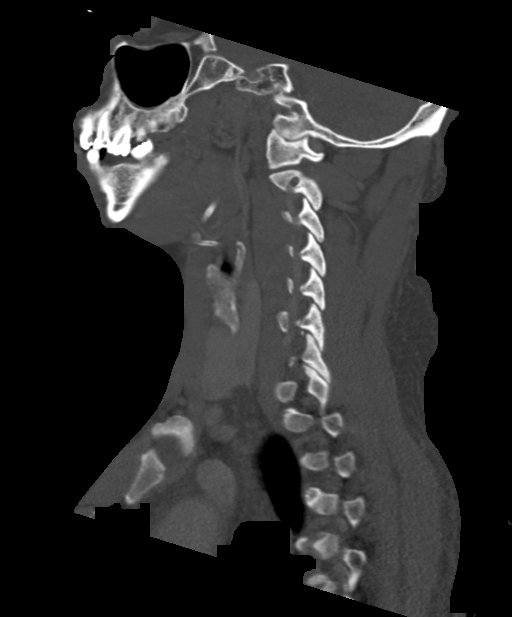
[im 46/91  soft-tissue]
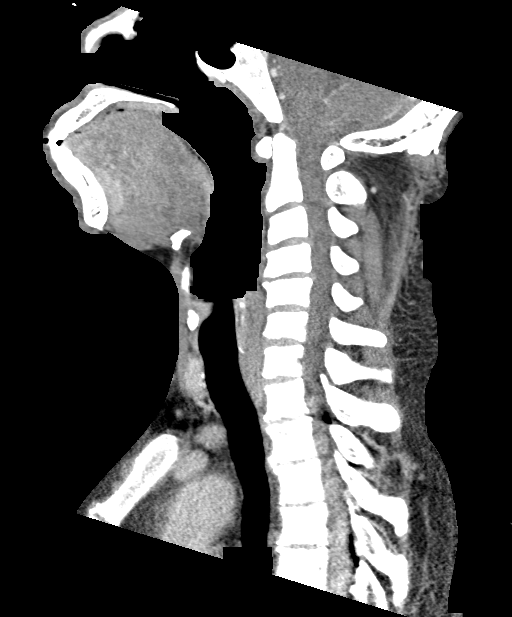
[im 46/91  bone]
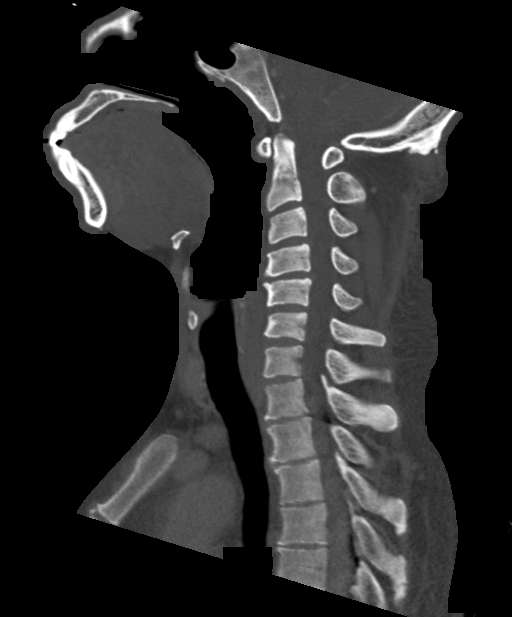
[im 53/91  bone]
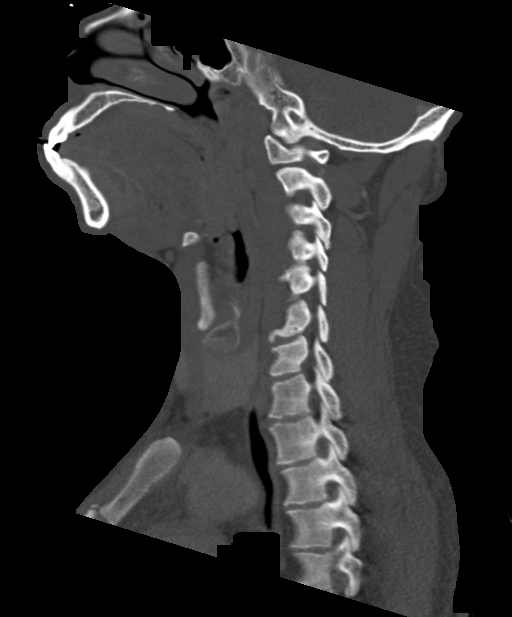
[im 61/91  bone]
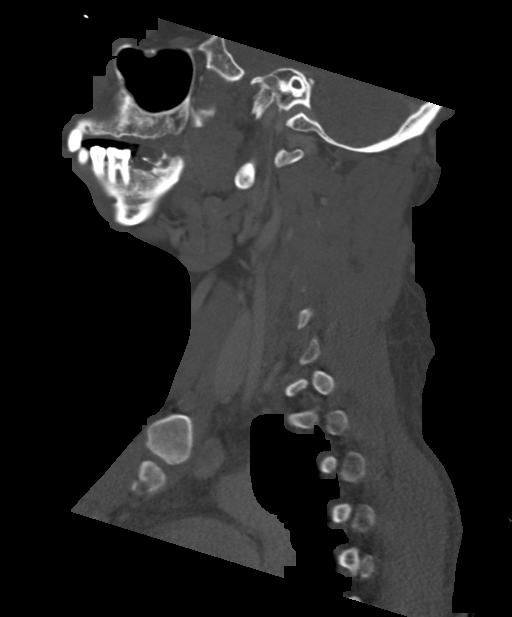

[12 of 33 positions shown; findings below may reference images not displayed]

RADIATION DOSE REDUCTION: This exam was performed according to the
departmental dose-optimization program which includes automated
exposure control, adjustment of the mA and/or kV according to
patient size and/or use of iterative reconstruction technique.

CONTRAST:  75mL OMNIPAQUE IOHEXOL 300 MG/ML  SOLN
FINDINGS: Pharynx and larynx: No visible mucosal based enhancement or mass.

Salivary glands: No inflammation, mass, or stone.

Thyroid: 2 nodules are seen in the right lobe, measuring up to 7 mm.
No followup recommended (ref: [HOSPITAL]. [DATE]):

Lymph nodes: No worrisome nodal enlargement.

Vascular: Negative.

Limited intracranial: Negative.

Visualized orbits: Negative.

Mastoids and visualized paranasal sinuses: Clear.

Skeleton: Extensive stylohyoid ligament ossification essentially
reaching the hyoid with asymmetric left-sided thickening. No
adjacent soft tissue inflammation is visible.

Upper chest: Negative
IMPRESSION: 1. No acute finding.
2. Extensive stylohyoid ligament ossification as seen with Hamama Ali
syndrome, please consider this entity given the presentation and
extent.

## 2023-03-14 ENCOUNTER — Telehealth: Admitting: Physician Assistant

## 2023-03-14 DIAGNOSIS — J4531 Mild persistent asthma with (acute) exacerbation: Secondary | ICD-10-CM

## 2023-03-14 MED ORDER — ALBUTEROL SULFATE HFA 108 (90 BASE) MCG/ACT IN AERS: 1.0000 | INHALATION_SPRAY | Freq: Four times a day (QID) | RESPIRATORY_TRACT | 0 refills | Status: DC | PRN

## 2023-03-14 MED ORDER — TIOTROPIUM BROMIDE-OLODATEROL 2.5-2.5 MCG/ACT IN AERS: 2.0000 | INHALATION_SPRAY | Freq: Every day | RESPIRATORY_TRACT | 0 refills | Status: DC

## 2023-03-14 MED ORDER — BENZONATATE 100 MG PO CAPS: 100.0000 mg | ORAL_CAPSULE | Freq: Three times a day (TID) | ORAL | 0 refills | Status: AC | PRN

## 2023-03-14 MED ORDER — PREDNISONE 20 MG PO TABS: 40.0000 mg | ORAL_TABLET | Freq: Every day | ORAL | 0 refills | Status: DC

## 2023-03-14 NOTE — Progress Notes (Signed)
 Virtual Visit Consent   Gary Hale, you are scheduled for a virtual visit with a [facility] provider today. Just as with appointments in the office, your consent must be obtained to participate. Your consent will be active for this visit and any virtual visit you may have with one of our providers in the next 365 days. If you have a MyChart account, a copy of this consent can be sent to you electronically.  As this is a virtual visit, video technology does not allow for your provider to perform a traditional examination. This may limit your provider's ability to fully assess your condition. If your provider identifies any concerns that need to be evaluated in person or the need to arrange testing (such as labs, EKG, etc.), we will make arrangements to do so. Although advances in technology are sophisticated, we cannot ensure that it will always work on either your end or our end. If the connection with a video visit is poor, the visit may have to be switched to a telephone visit. With either a video or telephone visit, we are not always able to ensure that we have a secure connection.  By engaging in this virtual visit, you consent to the provision of healthcare and authorize for your insurance to be billed (if applicable) for the services provided during this visit. Depending on your insurance coverage, you may receive a charge related to this service.  I need to obtain your verbal consent now. Are you willing to proceed with your visit today? Gary Hale has provided verbal consent on 03/14/2023 for a virtual visit (video or telephone). Margaretann Loveless, PA-C  Date: 03/14/2023 10:31 AM   Virtual Visit via Video Note   I, Margaretann Loveless, connected with  Gary Hale  ([id number], Nov 20, 1985) on 03/14/23 at 10:30 AM EST by a video-enabled telemedicine application and verified that I am speaking with the correct person using two identifiers.  Location: Patient: Virtual Visit Location  Patient: Home Provider: Virtual Visit Location Provider: Home Office   I discussed the limitations of evaluation and management by telemedicine and the availability of in person appointments. The patient expressed understanding and agreed to proceed.    History of Present Illness: Gary Hale is a 38 y.o. who identifies as a male who was assigned male at birth, and is being seen today for cough.  HPI: Cough This is a new problem. The current episode started yesterday. The problem has been gradually worsening. The problem occurs every few minutes. The cough is Non-productive. Associated symptoms include chest pain (tightness) and wheezing. Pertinent negatives include no chills, ear congestion, ear pain, fever, headaches, nasal congestion, postnasal drip, rhinorrhea, sore throat or shortness of breath. Associated symptoms comments: Hoarse voice. The symptoms are aggravated by lying down, cold air and pollens. He has tried a beta-agonist inhaler (stiolto inhaler) for the symptoms. The treatment provided no relief. His past medical history is significant for asthma.     Problems: There are no active problems to display for this patient.   Allergies: No Known Allergies Medications:  Current Outpatient Medications:    albuterol (VENTOLIN HFA) 108 (90 Base) MCG/ACT inhaler, Inhale 1-2 puffs into the lungs every 6 (six) hours as needed., Disp: 8 g, Rfl: 0   benzonatate (TESSALON) 100 MG capsule, Take 1-2 capsules (100-200 mg total) by mouth 3 (three) times daily as needed., Disp: 30 capsule, Rfl: 0   predniSONE (DELTASONE) 20 MG tablet, Take 2 tablets (40 mg  total) by mouth daily with breakfast., Disp: 10 tablet, Rfl: 0   Tiotropium Bromide-Olodaterol 2.5-2.5 MCG/ACT AERS, Inhale 2 puffs into the lungs daily., Disp: 4 g, Rfl: 0  Observations/Objective: Patient is well-developed, well-nourished in no acute distress.  Resting comfortably at home.  Head is normocephalic, atraumatic.  No labored  breathing.  Speech is clear and coherent with logical content.  Patient is alert and oriented at baseline.    Assessment and Plan: 1. Mild persistent asthma with exacerbation (Primary) - Tiotropium Bromide-Olodaterol 2.5-2.5 MCG/ACT AERS; Inhale 2 puffs into the lungs daily.  Dispense: 4 g; Refill: 0 - albuterol (VENTOLIN HFA) 108 (90 Base) MCG/ACT inhaler; Inhale 1-2 puffs into the lungs every 6 (six) hours as needed.  Dispense: 8 g; Refill: 0 - benzonatate (TESSALON) 100 MG capsule; Take 1-2 capsules (100-200 mg total) by mouth 3 (three) times daily as needed.  Dispense: 30 capsule; Refill: 0 - predniSONE (DELTASONE) 20 MG tablet; Take 2 tablets (40 mg total) by mouth daily with breakfast.  Dispense: 10 tablet; Refill: 0  - Worsening over a week despite OTC medications - Will treat with Prednisone and tessalon perles - Stiolto and Albuterol refilled - Can continue Mucinex  - Push fluids.  - Rest.  - Steam and humidifier can help - Seek in person evaluation if worsening or symptoms fail to improve    Follow Up Instructions: I discussed the assessment and treatment plan with the patient. The patient was provided an opportunity to ask questions and all were answered. The patient agreed with the plan and demonstrated an understanding of the instructions.  A copy of instructions were sent to the patient via MyChart unless otherwise noted below.    The patient was advised to call back or seek an in-person evaluation if the symptoms worsen or if the condition fails to improve as anticipated.    Margaretann Loveless, PA-C

## 2023-03-14 NOTE — Patient Instructions (Signed)
 Mosetta Putt, thank you for joining Margaretann Loveless, PA-C for today's virtual visit.  While this provider is not your primary care provider (PCP), if your PCP is located in our provider database this encounter information will be shared with them immediately following your visit.   A [facility] MyChart account gives you access to today's visit and all your visits, tests, and labs performed at [facility] " click here if you don't have a [facility] MyChart account or go to [email or web address]  Consent: (Patient) Gary Hale provided verbal consent for this virtual visit at the beginning of the encounter.  Current Medications:  Current Outpatient Medications:    albuterol (VENTOLIN HFA) 108 (90 Base) MCG/ACT inhaler, Inhale 1-2 puffs into the lungs every 6 (six) hours as needed., Disp: 8 g, Rfl: 0   benzonatate (TESSALON) 100 MG capsule, Take 1-2 capsules (100-200 mg total) by mouth 3 (three) times daily as needed., Disp: 30 capsule, Rfl: 0   predniSONE (DELTASONE) 20 MG tablet, Take 2 tablets (40 mg total) by mouth daily with breakfast., Disp: 10 tablet, Rfl: 0   Tiotropium Bromide-Olodaterol 2.5-2.5 MCG/ACT AERS, Inhale 2 puffs into the lungs daily., Disp: 4 g, Rfl: 0   Medications ordered in this encounter:  Meds ordered this encounter  Medications   Tiotropium Bromide-Olodaterol 2.5-2.5 MCG/ACT AERS    Sig: Inhale 2 puffs into the lungs daily.    Dispense:  4 g    Refill:  0    Supervising Provider:   Merrilee Jansky [[id number]]   albuterol (VENTOLIN HFA) 108 (90 Base) MCG/ACT inhaler    Sig: Inhale 1-2 puffs into the lungs every 6 (six) hours as needed.    Dispense:  8 g    Refill:  0    Supervising Provider:   Merrilee Jansky [[id number]]   benzonatate (TESSALON) 100 MG capsule    Sig: Take 1-2 capsules (100-200 mg total) by mouth 3 (three) times daily as needed.    Dispense:  30 capsule    Refill:  0    Supervising Provider:   Merrilee Jansky  [[id number]]   predniSONE (DELTASONE) 20 MG tablet    Sig: Take 2 tablets (40 mg total) by mouth daily with breakfast.    Dispense:  10 tablet    Refill:  0    Supervising Provider:   Merrilee Jansky [[id number]]     *If you need refills on other medications prior to your next appointment, please contact your pharmacy*  Follow-Up: Call back or seek an in-person evaluation if the symptoms worsen or if the condition fails to improve as anticipated.   Virtual Care [phone]  Other Instructions Upper Respiratory Infection, Adult An upper respiratory infection (URI) is a common viral infection of the nose, throat, and upper air passages that lead to the lungs. The most common type of URI is the common cold. URIs usually get better on their own, without medical treatment. What are the causes? A URI is caused by a virus. You may catch a virus by: Breathing in droplets from an infected person's cough or sneeze. Touching something that has been exposed to the virus (is contaminated) and then touching your mouth, nose, or eyes. What increases the risk? You are more likely to get a URI if: You are very young or very old. You have close contact with others, such as at work, school, or a health care facility. You smoke. You have [place]-term (chronic) heart or  lung disease. You have a weakened disease-fighting system (immune system). You have nasal allergies or asthma. You are experiencing a lot of stress. You have poor nutrition. What are the signs or symptoms? A URI usually involves some of the following symptoms: Runny or stuffy (congested) nose. Cough. Sneezing. Sore throat. Headache. Fatigue. Fever. Loss of appetite. Pain in your forehead, behind your eyes, and over your cheekbones (sinus pain). Muscle aches. Redness or irritation of the eyes. Pressure in the ears or face. How is this diagnosed? This condition may be diagnosed based on your medical history and  symptoms, and a physical exam. Your health care provider may use a swab to take a mucus sample from your nose (nasal swab). This sample can be tested to determine what virus is causing the illness. How is this treated? URIs usually get better on their own within 7-10 days. Medicines cannot cure URIs, but your health care provider may recommend certain medicines to help relieve symptoms, such as: Over-the-counter cold medicines. Cough suppressants. Coughing is a type of defense against infection that helps to clear the respiratory system, so take these medicines only as recommended by your health care provider. Fever-reducing medicines. Follow these instructions at home: Activity Rest as needed. If you have a fever, stay home from work or school until your fever is gone or until your health care provider says your URI cannot spread to other people (is no longer contagious). Your health care provider may have you wear a face mask to prevent your infection from spreading. Relieving symptoms Gargle with a mixture of salt and water 3-4 times a day or as needed. To make salt water, completely dissolve -1 tsp (3-6 g) of salt in 1 cup (237 mL) of warm water. Use a cool-mist humidifier to add moisture to the air. This can help you breathe more easily. Eating and drinking  Drink enough fluid to keep your urine pale yellow. Eat soups and other clear broths. General instructions  Take over-the-counter and prescription medicines only as told by your health care provider. These include cold medicines, fever reducers, and cough suppressants. Do not use any products that contain nicotine or tobacco. These products include cigarettes, chewing tobacco, and vaping devices, such as e-cigarettes. If you need help quitting, ask your health care provider. Stay away from secondhand smoke. Stay up to date on all immunizations, including the yearly (annual) flu vaccine. Keep all follow-up visits. This is  important. How to prevent the spread of infection to others URIs can be contagious. To prevent the infection from spreading: Wash your hands with soap and water for at least 20 seconds. If soap and water are not available, use hand sanitizer. Avoid touching your mouth, face, eyes, or nose. Cough or sneeze into a tissue or your sleeve or elbow instead of into your hand or into the air.  Contact a health care provider if: You are getting worse instead of better. You have a fever or chills. Your mucus is brown or red. You have yellow or brown discharge coming from your nose. You have pain in your face, especially when you bend forward. You have swollen neck glands. You have pain while swallowing. You have white areas in the back of your throat. Get help right away if: You have shortness of breath that gets worse. You have severe or persistent: Headache. Ear pain. Sinus pain. Chest pain. You have chronic lung disease along with any of the following: Making high-pitched whistling sounds when you breathe, most often  when you breathe out (wheezing). Prolonged cough (more than 14 days). Coughing up blood. A change in your usual mucus. You have a stiff neck. You have changes in your: Vision. Hearing. Thinking. Mood. These symptoms may be an emergency. Get help right away. Call 911. Do not wait to see if the symptoms will go away. Do not drive yourself to the hospital. Summary An upper respiratory infection (URI) is a common infection of the nose, throat, and upper air passages that lead to the lungs. A URI is caused by a virus. URIs usually get better on their own within 7-10 days. Medicines cannot cure URIs, but your health care provider may recommend certain medicines to help relieve symptoms. This information is not intended to replace advice given to you by your health care provider. Make sure you discuss any questions you have with your health care provider. Document Revised:  07/30/2020 Document Reviewed: 07/30/2020 Elsevier Patient Education  2024 Elsevier Inc.   If you have been instructed to have an in-person evaluation today at a local Urgent Care facility, please use the link below. It will take you to a list of all of our available [facility], including address, phone number and hours of operation. Please do not delay care.  [facility]  If you or a family member do not have a primary care provider, use the link below to schedule a visit and establish care. When you choose a [facility] primary care physician or advanced practice provider, you gain a [place]-term partner in health. Find a Primary Care Provider  Learn more about [facility]'s in-office and virtual care options: [facility] - Get Care Now

## 2023-03-15 MED ORDER — TIOTROPIUM BROMIDE-OLODATEROL 2.5-2.5 MCG/ACT IN AERS: 2.0000 | INHALATION_SPRAY | Freq: Every day | RESPIRATORY_TRACT | 0 refills | Status: AC

## 2023-03-15 MED ORDER — ALBUTEROL SULFATE HFA 108 (90 BASE) MCG/ACT IN AERS: 1.0000 | INHALATION_SPRAY | Freq: Four times a day (QID) | RESPIRATORY_TRACT | 0 refills | Status: DC | PRN

## 2023-03-15 NOTE — Addendum Note (Signed)
 Addended by: Margaretann Loveless on: 03/15/2023 10:59 AM   Modules accepted: Orders

## 2023-10-09 ENCOUNTER — Telehealth: Payer: Self-pay | Admitting: Physician Assistant

## 2023-10-09 DIAGNOSIS — J069 Acute upper respiratory infection, unspecified: Secondary | ICD-10-CM

## 2023-10-09 MED ORDER — ALBUTEROL SULFATE HFA 108 (90 BASE) MCG/ACT IN AERS: 2.0000 | INHALATION_SPRAY | Freq: Four times a day (QID) | RESPIRATORY_TRACT | 0 refills | Status: AC | PRN

## 2023-10-09 MED ORDER — PROMETHAZINE-DM 6.25-15 MG/5ML PO SYRP: 5.0000 mL | ORAL_SOLUTION | Freq: Four times a day (QID) | ORAL | 0 refills | Status: AC | PRN

## 2023-10-09 NOTE — Patient Instructions (Signed)
  Quintin LITTIE Law, thank you for joining Harlene PEDLAR Ward, PA-C for today's virtual visit.  While this provider is not your primary care provider (PCP), if your PCP is located in our provider database this encounter information will be shared with them immediately following your visit.   A [facility] MyChart account gives you access to today's visit and all your visits, tests, and labs performed at [facility]  click here if you don't have a [facility] MyChart account or go to [email or web address]  Consent: (Patient) Ordell Prichett Kau provided verbal consent for this virtual visit at the beginning of the encounter.  Current Medications:  Current Outpatient Medications:    albuterol  (VENTOLIN  HFA) 108 (90 Base) MCG/ACT inhaler, Inhale 2 puffs into the lungs every 6 (six) hours as needed for wheezing or shortness of breath., Disp: 8 g, Rfl: 0   promethazine-dextromethorphan (PROMETHAZINE-DM) 6.25-15 MG/5ML syrup, Take 5 mLs by mouth 4 (four) times daily as needed for cough., Disp: 118 mL, Rfl: 0   benzonatate  (TESSALON ) 100 MG capsule, Take 1-2 capsules (100-200 mg total) by mouth 3 (three) times daily as needed., Disp: 30 capsule, Rfl: 0   predniSONE  (DELTASONE ) 20 MG tablet, Take 2 tablets (40 mg total) by mouth daily with breakfast., Disp: 10 tablet, Rfl: 0   Tiotropium Bromide -Olodaterol 2.5-2.5 MCG/ACT AERS, Inhale 2 puffs into the lungs daily., Disp: 4 g, Rfl: 0   Medications ordered in this encounter:  Meds ordered this encounter  Medications   albuterol  (VENTOLIN  HFA) 108 (90 Base) MCG/ACT inhaler    Sig: Inhale 2 puffs into the lungs every 6 (six) hours as needed for wheezing or shortness of breath.    Dispense:  8 g    Refill:  0    Supervising Provider:   BLAISE ALEENE KIDD [[id number]]   promethazine-dextromethorphan (PROMETHAZINE-DM) 6.25-15 MG/5ML syrup    Sig: Take 5 mLs by mouth 4 (four) times daily as needed for cough.    Dispense:  118 mL    Refill:  0     Supervising Provider:   BLAISE ALEENE KIDD [[id number]]     *If you need refills on other medications prior to your next appointment, please contact your pharmacy*  Follow-Up: Call back or seek an in-person evaluation if the symptoms worsen or if the condition fails to improve as anticipated.  [facility] [phone]  Other Instructions Drink plenty of fluids, rest.  I have sent in a refill of your inhaler.  Can continue with Dayquil.  If no improvement or symptoms become worse please be seen in Urgent Care for further evaluation.    If you have been instructed to have an in-person evaluation today at a local Urgent Care facility, please use the link below. It will take you to a list of all of our available [facility], including address, phone number and hours of operation. Please do not delay care.  [facility]  If you or a family member do not have a primary care provider, use the link below to schedule a visit and establish care. When you choose a [facility] primary care physician or advanced practice provider, you gain a [place]-term partner in health. Find a Primary Care Provider  Learn more about [facility]'s in-office and virtual care options:  - Get Care Now

## 2023-10-09 NOTE — Progress Notes (Signed)
 Virtual Visit Consent   Gary Hale, you are scheduled for a virtual visit with a [facility] provider today. Just as with appointments in the office, your consent must be obtained to participate. Your consent will be active for this visit and any virtual visit you may have with one of our providers in the next 365 days. If you have a MyChart account, a copy of this consent can be sent to you electronically.  As this is a virtual visit, video technology does not allow for your provider to perform a traditional examination. This may limit your provider's ability to fully assess your condition. If your provider identifies any concerns that need to be evaluated in person or the need to arrange testing (such as labs, EKG, etc.), we will make arrangements to do so. Although advances in technology are sophisticated, we cannot ensure that it will always work on either your end or our end. If the connection with a video visit is poor, the visit may have to be switched to a telephone visit. With either a video or telephone visit, we are not always able to ensure that we have a secure connection.  By engaging in this virtual visit, you consent to the provision of healthcare and authorize for your insurance to be billed (if applicable) for the services provided during this visit. Depending on your insurance coverage, you may receive a charge related to this service.  I need to obtain your verbal consent now. Are you willing to proceed with your visit today? Gary Hale has provided verbal consent on 10/09/2023 for a virtual visit (video or telephone). Harlene PEDLAR Ward, PA-C  Date: 10/09/2023 10:51 AM   Virtual Visit via Video Note   I, Harlene PEDLAR Ward, connected with  Gary Hale  ([id number], 07-17-1985) on 10/09/23 at 10:45 AM EDT by a video-enabled telemedicine application and verified that I am speaking with the correct person using two identifiers.  Location: Patient: Virtual Visit Location Patient:  Home Provider: Virtual Visit Location Provider: Home Office   I discussed the limitations of evaluation and management by telemedicine and the availability of in person appointments. The patient expressed understanding and agreed to proceed.    History of Present Illness: Gary Hale is a 38 y.o. who identifies as a male who was assigned male at birth, and is being seen today for cough and congestion that started yesterday. He reports subjective fever.  He has not been able to take a covid or flu.  He does have a h/o asthma, reports needing his inhaler last night, but inhaler is now empty.  Denies wheezing or shortness of breath.   HPI: HPI  Problems: There are no active problems to display for this patient.   Allergies: No Known Allergies Medications:  Current Outpatient Medications:    albuterol  (VENTOLIN  HFA) 108 (90 Base) MCG/ACT inhaler, Inhale 2 puffs into the lungs every 6 (six) hours as needed for wheezing or shortness of breath., Disp: 8 g, Rfl: 0   promethazine-dextromethorphan (PROMETHAZINE-DM) 6.25-15 MG/5ML syrup, Take 5 mLs by mouth 4 (four) times daily as needed for cough., Disp: 118 mL, Rfl: 0   benzonatate  (TESSALON ) 100 MG capsule, Take 1-2 capsules (100-200 mg total) by mouth 3 (three) times daily as needed., Disp: 30 capsule, Rfl: 0   predniSONE  (DELTASONE ) 20 MG tablet, Take 2 tablets (40 mg total) by mouth daily with breakfast., Disp: 10 tablet, Rfl: 0   Tiotropium Bromide -Olodaterol 2.5-2.5 MCG/ACT AERS, Inhale 2 puffs  into the lungs daily., Disp: 4 g, Rfl: 0  Observations/Objective: Patient is well-developed, well-nourished in no acute distress.  Resting comfortably at home.  Head is normocephalic, atraumatic.  No labored breathing.  Speech is clear and coherent with logical content.  Patient is alert and oriented at baseline.    Assessment and Plan: 1. Viral upper respiratory tract infection (Primary)  URI, supportive care discussed. Albuterol  inhaler  refilled.  In person evaluation precautions discussed.   Follow Up Instructions: I discussed the assessment and treatment plan with the patient. The patient was provided an opportunity to ask questions and all were answered. The patient agreed with the plan and demonstrated an understanding of the instructions.  A copy of instructions were sent to the patient via MyChart unless otherwise noted below.     The patient was advised to call back or seek an in-person evaluation if the symptoms worsen or if the condition fails to improve as anticipated.    Harlene PEDLAR Ward, PA-C

## 2024-01-14 ENCOUNTER — Emergency Department (HOSPITAL_COMMUNITY)
Admission: EM | Admit: 2024-01-14 | Discharge: 2024-01-14 | Discharge status: Against Medical Advice | Payer: Self-pay | Attending: Emergency Medicine | Admitting: Emergency Medicine

## 2024-01-14 DIAGNOSIS — Z5321 Procedure and treatment not carried out due to patient leaving prior to being seen by health care provider: Secondary | ICD-10-CM | POA: Insufficient documentation

## 2024-01-14 DIAGNOSIS — M542 Cervicalgia: Secondary | ICD-10-CM | POA: Insufficient documentation

## 2024-01-14 DIAGNOSIS — R1310 Dysphagia, unspecified: Secondary | ICD-10-CM | POA: Insufficient documentation

## 2024-01-14 DIAGNOSIS — R070 Pain in throat: Secondary | ICD-10-CM | POA: Insufficient documentation

## 2024-01-14 LAB — GROUP A STREP BY PCR: Group A Strep by PCR: NOT DETECTED

## 2024-01-14 NOTE — ED Triage Notes (Signed)
 Patient reports pain in throat/left side of neck starting last night Says he had same pain 3 years ago  Pain rated 8/10 Difficulty swallowing

## 2024-01-16 ENCOUNTER — Telehealth: Payer: Self-pay

## 2024-01-16 DIAGNOSIS — H9202 Otalgia, left ear: Secondary | ICD-10-CM

## 2024-01-16 DIAGNOSIS — M542 Cervicalgia: Secondary | ICD-10-CM

## 2024-01-16 MED ORDER — PREDNISONE 20 MG PO TABS: 40.0000 mg | ORAL_TABLET | Freq: Every day | ORAL | 0 refills | Status: AC

## 2024-01-16 NOTE — Progress Notes (Signed)
 " Virtual Visit Consent   Gary Hale, you are scheduled for a virtual visit with a [facility] provider today. Just as with appointments in the office, your consent must be obtained to participate. Your consent will be active for this visit and any virtual visit you may have with one of our providers in the next 365 days. If you have a MyChart account, a copy of this consent can be sent to you electronically.  As this is a virtual visit, video technology does not allow for your provider to perform a traditional examination. This may limit your provider's ability to fully assess your condition. If your provider identifies any concerns that need to be evaluated in person or the need to arrange testing (such as labs, EKG, etc.), we will make arrangements to do so. Although advances in technology are sophisticated, we cannot ensure that it will always work on either your end or our end. If the connection with a video visit is poor, the visit may have to be switched to a telephone visit. With either a video or telephone visit, we are not always able to ensure that we have a secure connection.  By engaging in this virtual visit, you consent to the provision of healthcare and authorize for your insurance to be billed (if applicable) for the services provided during this visit. Depending on your insurance coverage, you may receive a charge related to this service.  I need to obtain your verbal consent now. Are you willing to proceed with your visit today? Gary Hale has provided verbal consent on 01/16/2024 for a virtual visit (video or telephone). Jon CHRISTELLA Belt, NP  Date: 01/16/2024 12:19 PM   Virtual Visit via Video Note   I, Jon CHRISTELLA Belt, connected with  Gary Hale  ([id number], 07-13-1985) on 01/16/2024 at 12:15 PM EST by a video-enabled telemedicine application and verified that I am speaking with the correct person using two identifiers.  Location: Patient: Virtual Visit Location Patient:  Home Provider: Virtual Visit Location Provider: Home Office   I discussed the limitations of evaluation and management by telemedicine and the availability of in person appointments. The patient expressed understanding and agreed to proceed.    History of Present Illness: Gary Hale is a 39 y.o. who identifies as a male who was assigned male at birth, and is being seen today for Neck pain and L ear, pain when turn head and when swallow. This happened before, in Feb of 2023. 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 Pt feels like he has this same problem again, requests steroids.   Sx all started with illness. Vomited x1 morning of 01/13/24. Took dayquil/nyquil, sick sx felt better, now feels the neck and ear pain.   Aleve  helps with the pain but not completely. He can breathe and swallow. He feels his glands on L side of neck in submandibular area are swollen. No SOB. No fever or chills.   HPI: HPI  Problems: There are no active problems to display for this patient.   Allergies: Allergies[1] Medications: Current Medications[2]  Observations/Objective: Patient is well-developed, well-nourished in no acute distress.  Resting comfortably  at home.  Head is normocephalic, atraumatic.  No labored breathing.  Speech is clear and coherent with logical content.  Patient is alert and oriented at baseline.    Assessment and Plan: 1. Neck pain (Primary)  2. Left ear pain  Rx prednisone  as requested. Pt to f/u  in person at ER if develops SOB or severe sx.   Follow Up Instructions: I discussed the assessment and treatment plan with the patient. The patient was provided an opportunity to ask questions and all were answered. The patient agreed with the plan and demonstrated an understanding of the instructions.  A copy of instructions were sent to the patient via MyChart unless otherwise noted below.    The patient was advised to call back or seek  an in-person evaluation if the symptoms worsen or if the condition fails to improve as anticipated.    Jon CHRISTELLA Belt, NP    [1] No Known Allergies [2]  Current Outpatient Medications:    predniSONE  (DELTASONE ) 20 MG tablet, Take 2 tablets (40 mg total) by mouth daily with breakfast for 5 days., Disp: 10 tablet, Rfl: 0   albuterol  (VENTOLIN  HFA) 108 (90 Base) MCG/ACT inhaler, Inhale 2 puffs into the lungs every 6 (six) hours as needed for wheezing or shortness of breath., Disp: 8 g, Rfl: 0   benzonatate  (TESSALON ) 100 MG capsule, Take 1-2 capsules (100-200 mg total) by mouth 3 (three) times daily as needed., Disp: 30 capsule, Rfl: 0   promethazine -dextromethorphan (PROMETHAZINE -DM) 6.25-15 MG/5ML syrup, Take 5 mLs by mouth 4 (four) times daily as needed for cough., Disp: 118 mL, Rfl: 0   Tiotropium Bromide -Olodaterol 2.5-2.5 MCG/ACT AERS, Inhale 2 puffs into the lungs daily., Disp: 4 g, Rfl: 0  "

## 2024-01-16 NOTE — Patient Instructions (Signed)
" °  Quintin LITTIE Law, thank you for joining Jon CHRISTELLA Belt, NP for today's virtual visit.  While this provider is not your primary care provider (PCP), if your PCP is located in our provider database this encounter information will be shared with them immediately following your visit.   A [facility] MyChart account gives you access to today's visit and all your visits, tests, and labs performed at [facility]  click here if you don't have a [facility] MyChart account or go to [email or web address]  Consent: (Patient) Gary Hale provided verbal consent for this virtual visit at the beginning of the encounter.  Current Medications:  Current Outpatient Medications:    predniSONE  (DELTASONE ) 20 MG tablet, Take 2 tablets (40 mg total) by mouth daily with breakfast for 5 days., Disp: 10 tablet, Rfl: 0   albuterol  (VENTOLIN  HFA) 108 (90 Base) MCG/ACT inhaler, Inhale 2 puffs into the lungs every 6 (six) hours as needed for wheezing or shortness of breath., Disp: 8 g, Rfl: 0   benzonatate  (TESSALON ) 100 MG capsule, Take 1-2 capsules (100-200 mg total) by mouth 3 (three) times daily as needed., Disp: 30 capsule, Rfl: 0   promethazine -dextromethorphan (PROMETHAZINE -DM) 6.25-15 MG/5ML syrup, Take 5 mLs by mouth 4 (four) times daily as needed for cough., Disp: 118 mL, Rfl: 0   Tiotropium Bromide -Olodaterol 2.5-2.5 MCG/ACT AERS, Inhale 2 puffs into the lungs daily., Disp: 4 g, Rfl: 0   Medications ordered in this encounter:  Meds ordered this encounter  Medications   predniSONE  (DELTASONE ) 20 MG tablet    Sig: Take 2 tablets (40 mg total) by mouth daily with breakfast for 5 days.    Dispense:  10 tablet    Refill:  0     *If you need refills on other medications prior to your next appointment, please contact your pharmacy*  Follow-Up: Call back or seek an in-person evaluation if the symptoms worsen or if the condition fails to improve as anticipated.  [facility] [phone]  Other Instructions  If you cannot breathe, call [phone]. If you are having mild trouble breathing or swallowing and the steroids are not helping it, please get checked in person.    If you have been instructed to have an in-person evaluation today at a local Urgent Care facility, please use the link below. It will take you to a list of all of our available [facility], including address, phone number and hours of operation. Please do not delay care.  [facility]  If you or a family member do not have a primary care provider, use the link below to schedule a visit and establish care. When you choose a [facility] primary care physician or advanced practice provider, you gain a [facility]-term partner in health. Find a Primary Care Provider  Learn more about [facility]'s in-office and virtual care options: [facility] - Get Care Now  "
